# Patient Record
Sex: Male | Born: 1935 | Race: White | Hispanic: No | Marital: Married | Smoking: Never smoker
Health system: Southern US, Community
[De-identification: ages and names within clinical notes are randomized; demographics above are authoritative.]

## PROBLEM LIST (undated history)

## (undated) DIAGNOSIS — C801 Malignant (primary) neoplasm, unspecified: Secondary | ICD-10-CM

## (undated) DIAGNOSIS — E119 Type 2 diabetes mellitus without complications: Secondary | ICD-10-CM

## (undated) DIAGNOSIS — L89159 Pressure ulcer of sacral region, unspecified stage: Secondary | ICD-10-CM

## (undated) DIAGNOSIS — I219 Acute myocardial infarction, unspecified: Secondary | ICD-10-CM

## (undated) DIAGNOSIS — I4891 Unspecified atrial fibrillation: Secondary | ICD-10-CM

---

## 1998-11-09 ENCOUNTER — Inpatient Hospital Stay (HOSPITAL_COMMUNITY): Admission: AD | Admit: 1998-11-09 | Discharge: 1998-11-11 | Payer: Self-pay | Admitting: Cardiology

## 2005-06-21 ENCOUNTER — Ambulatory Visit: Payer: Self-pay | Admitting: Family Medicine

## 2005-11-24 ENCOUNTER — Inpatient Hospital Stay (HOSPITAL_COMMUNITY): Admission: RE | Admit: 2005-11-24 | Discharge: 2005-11-25 | Payer: Self-pay | Admitting: Cardiology

## 2005-11-24 ENCOUNTER — Ambulatory Visit: Payer: Self-pay | Admitting: Cardiovascular Disease

## 2008-02-25 ENCOUNTER — Encounter: Payer: Self-pay | Admitting: Urology

## 2008-03-24 ENCOUNTER — Encounter: Payer: Self-pay | Admitting: Urology

## 2008-04-23 ENCOUNTER — Encounter: Payer: Self-pay | Admitting: Urology

## 2008-05-24 ENCOUNTER — Encounter: Payer: Self-pay | Admitting: Urology

## 2008-06-24 ENCOUNTER — Encounter: Payer: Self-pay | Admitting: Urology

## 2009-01-12 ENCOUNTER — Ambulatory Visit: Payer: Self-pay

## 2011-08-29 ENCOUNTER — Emergency Department: Payer: Self-pay | Admitting: Emergency Medicine

## 2014-06-06 DIAGNOSIS — M8580 Other specified disorders of bone density and structure, unspecified site: Secondary | ICD-10-CM | POA: Insufficient documentation

## 2014-06-06 DIAGNOSIS — E785 Hyperlipidemia, unspecified: Secondary | ICD-10-CM | POA: Insufficient documentation

## 2014-06-06 DIAGNOSIS — E119 Type 2 diabetes mellitus without complications: Secondary | ICD-10-CM | POA: Insufficient documentation

## 2014-06-06 DIAGNOSIS — Z8546 Personal history of malignant neoplasm of prostate: Secondary | ICD-10-CM | POA: Insufficient documentation

## 2014-06-06 DIAGNOSIS — I2510 Atherosclerotic heart disease of native coronary artery without angina pectoris: Secondary | ICD-10-CM | POA: Insufficient documentation

## 2014-06-06 DIAGNOSIS — I10 Essential (primary) hypertension: Secondary | ICD-10-CM | POA: Insufficient documentation

## 2016-03-08 ENCOUNTER — Emergency Department: Payer: Medicare Other

## 2016-03-08 ENCOUNTER — Inpatient Hospital Stay: Payer: Medicare Other

## 2016-03-08 ENCOUNTER — Encounter: Payer: Self-pay | Admitting: *Deleted

## 2016-03-08 ENCOUNTER — Inpatient Hospital Stay
Admission: EM | Admit: 2016-03-08 | Discharge: 2016-03-15 | DRG: 853 | Disposition: A | Discharge status: Skilled Nursing Facility | Payer: Medicare Other | Attending: Internal Medicine | Admitting: Internal Medicine

## 2016-03-08 DIAGNOSIS — L8990 Pressure ulcer of unspecified site, unspecified stage: Secondary | ICD-10-CM | POA: Diagnosis present

## 2016-03-08 DIAGNOSIS — Z7982 Long term (current) use of aspirin: Secondary | ICD-10-CM

## 2016-03-08 DIAGNOSIS — E1065 Type 1 diabetes mellitus with hyperglycemia: Secondary | ICD-10-CM

## 2016-03-08 DIAGNOSIS — I248 Other forms of acute ischemic heart disease: Secondary | ICD-10-CM | POA: Diagnosis present

## 2016-03-08 DIAGNOSIS — B9561 Methicillin susceptible Staphylococcus aureus infection as the cause of diseases classified elsewhere: Secondary | ICD-10-CM | POA: Diagnosis present

## 2016-03-08 DIAGNOSIS — I4891 Unspecified atrial fibrillation: Secondary | ICD-10-CM | POA: Diagnosis not present

## 2016-03-08 DIAGNOSIS — D62 Acute posthemorrhagic anemia: Secondary | ICD-10-CM | POA: Diagnosis not present

## 2016-03-08 DIAGNOSIS — R4182 Altered mental status, unspecified: Secondary | ICD-10-CM | POA: Diagnosis present

## 2016-03-08 DIAGNOSIS — B965 Pseudomonas (aeruginosa) (mallei) (pseudomallei) as the cause of diseases classified elsewhere: Secondary | ICD-10-CM | POA: Diagnosis present

## 2016-03-08 DIAGNOSIS — I10 Essential (primary) hypertension: Secondary | ICD-10-CM | POA: Diagnosis present

## 2016-03-08 DIAGNOSIS — I252 Old myocardial infarction: Secondary | ICD-10-CM | POA: Diagnosis not present

## 2016-03-08 DIAGNOSIS — L89153 Pressure ulcer of sacral region, stage 3: Secondary | ICD-10-CM | POA: Diagnosis present

## 2016-03-08 DIAGNOSIS — I959 Hypotension, unspecified: Secondary | ICD-10-CM | POA: Diagnosis present

## 2016-03-08 DIAGNOSIS — Z880 Allergy status to penicillin: Secondary | ICD-10-CM

## 2016-03-08 DIAGNOSIS — R627 Adult failure to thrive: Secondary | ICD-10-CM | POA: Diagnosis not present

## 2016-03-08 DIAGNOSIS — E1165 Type 2 diabetes mellitus with hyperglycemia: Secondary | ICD-10-CM | POA: Diagnosis present

## 2016-03-08 DIAGNOSIS — I2510 Atherosclerotic heart disease of native coronary artery without angina pectoris: Secondary | ICD-10-CM | POA: Diagnosis present

## 2016-03-08 DIAGNOSIS — IMO0001 Reserved for inherently not codable concepts without codable children: Secondary | ICD-10-CM

## 2016-03-08 DIAGNOSIS — Z79899 Other long term (current) drug therapy: Secondary | ICD-10-CM

## 2016-03-08 DIAGNOSIS — E785 Hyperlipidemia, unspecified: Secondary | ICD-10-CM | POA: Diagnosis present

## 2016-03-08 DIAGNOSIS — N3289 Other specified disorders of bladder: Secondary | ICD-10-CM | POA: Diagnosis present

## 2016-03-08 DIAGNOSIS — L089 Local infection of the skin and subcutaneous tissue, unspecified: Secondary | ICD-10-CM

## 2016-03-08 DIAGNOSIS — I482 Chronic atrial fibrillation: Secondary | ICD-10-CM | POA: Diagnosis present

## 2016-03-08 DIAGNOSIS — T148XXA Other injury of unspecified body region, initial encounter: Secondary | ICD-10-CM

## 2016-03-08 DIAGNOSIS — E43 Unspecified severe protein-calorie malnutrition: Secondary | ICD-10-CM | POA: Diagnosis present

## 2016-03-08 DIAGNOSIS — Z7984 Long term (current) use of oral hypoglycemic drugs: Secondary | ICD-10-CM | POA: Diagnosis not present

## 2016-03-08 DIAGNOSIS — Z7401 Bed confinement status: Secondary | ICD-10-CM | POA: Diagnosis not present

## 2016-03-08 DIAGNOSIS — I2699 Other pulmonary embolism without acute cor pulmonale: Secondary | ICD-10-CM | POA: Diagnosis present

## 2016-03-08 DIAGNOSIS — R6251 Failure to thrive (child): Secondary | ICD-10-CM | POA: Insufficient documentation

## 2016-03-08 DIAGNOSIS — N390 Urinary tract infection, site not specified: Secondary | ICD-10-CM | POA: Diagnosis present

## 2016-03-08 DIAGNOSIS — A419 Sepsis, unspecified organism: Secondary | ICD-10-CM | POA: Diagnosis present

## 2016-03-08 HISTORY — DX: Unspecified atrial fibrillation: I48.91

## 2016-03-08 HISTORY — DX: Type 2 diabetes mellitus without complications: E11.9

## 2016-03-08 HISTORY — DX: Malignant (primary) neoplasm, unspecified: C80.1

## 2016-03-08 HISTORY — DX: Acute myocardial infarction, unspecified: I21.9

## 2016-03-08 LAB — LACTIC ACID, PLASMA
LACTIC ACID, VENOUS: 2.9 mmol/L — AB (ref 0.5–2.0)
Lactic Acid, Venous: 4.5 mmol/L (ref 0.5–2.0)

## 2016-03-08 LAB — URINALYSIS COMPLETE WITH MICROSCOPIC (ARMC ONLY)
BILIRUBIN URINE: NEGATIVE
Bacteria, UA: NONE SEEN
Nitrite: NEGATIVE
Protein, ur: NEGATIVE mg/dL
Specific Gravity, Urine: 1.023 (ref 1.005–1.030)
pH: 5 (ref 5.0–8.0)

## 2016-03-08 LAB — GLUCOSE, CAPILLARY
GLUCOSE-CAPILLARY: 555 mg/dL — AB (ref 65–99)
GLUCOSE-CAPILLARY: 463 mg/dL — AB (ref 65–99)
Glucose-Capillary: 491 mg/dL — ABNORMAL HIGH (ref 65–99)
Glucose-Capillary: 356 mg/dL — ABNORMAL HIGH (ref 65–99)

## 2016-03-08 LAB — HEPATIC FUNCTION PANEL
ALBUMIN: 2.4 g/dL — AB (ref 3.5–5.0)
ALK PHOS: 68 U/L (ref 38–126)
ALT: 18 U/L (ref 17–63)
AST: 31 U/L (ref 15–41)
BILIRUBIN TOTAL: 1.2 mg/dL (ref 0.3–1.2)
Bilirubin, Direct: 0.3 mg/dL (ref 0.1–0.5)
Indirect Bilirubin: 0.9 mg/dL (ref 0.3–0.9)
TOTAL PROTEIN: 5.6 g/dL — AB (ref 6.5–8.1)

## 2016-03-08 LAB — CBC
HCT: 40.4 % (ref 40.0–52.0)
Hemoglobin: 12.7 g/dL — ABNORMAL LOW (ref 13.0–18.0)
MCH: 30.5 pg (ref 26.0–34.0)
MCHC: 31.6 g/dL — ABNORMAL LOW (ref 32.0–36.0)
MCV: 96.5 fL (ref 80.0–100.0)
PLATELETS: 171 10*3/uL (ref 150–440)
RBC: 4.18 MIL/uL — AB (ref 4.40–5.90)
RDW: 14.7 % — AB (ref 11.5–14.5)
WBC: 12.7 10*3/uL — AB (ref 3.8–10.6)

## 2016-03-08 LAB — TROPONIN I
TROPONIN I: 0.16 ng/mL — AB (ref <0.031)
Troponin I: 0.06 ng/mL — ABNORMAL HIGH (ref <0.031)

## 2016-03-08 LAB — DIGOXIN LEVEL: DIGOXIN LVL: 0.2 ng/mL — AB (ref 0.8–2.0)

## 2016-03-08 LAB — BASIC METABOLIC PANEL
ANION GAP: 15 (ref 5–15)
BUN: 36 mg/dL — ABNORMAL HIGH (ref 6–20)
CALCIUM: 8 mg/dL — AB (ref 8.9–10.3)
CHLORIDE: 99 mmol/L — AB (ref 101–111)
CO2: 14 mmol/L — AB (ref 22–32)
Creatinine, Ser: 0.64 mg/dL (ref 0.61–1.24)
GFR calc non Af Amer: >60 mL/min (ref >60)
Glucose, Bld: 515 mg/dL (ref 65–99)
POTASSIUM: 4 mmol/L (ref 3.5–5.1)
Sodium: 128 mmol/L — ABNORMAL LOW (ref 135–145)

## 2016-03-08 LAB — BRAIN NATRIURETIC PEPTIDE: B Natriuretic Peptide: 258 pg/mL — ABNORMAL HIGH (ref 0.0–100.0)

## 2016-03-08 MED ORDER — INSULIN ASPART 100 UNIT/ML ~~LOC~~ SOLN
0.0000 [IU] | Freq: Three times a day (TID) | SUBCUTANEOUS | Status: DC
  Administered 2016-03-09 (×2): 5 [IU] via SUBCUTANEOUS
  Administered 2016-03-09 – 2016-03-10 (×2): 8 [IU] via SUBCUTANEOUS
  Administered 2016-03-10: 11 [IU] via SUBCUTANEOUS
  Filled 2016-03-08: qty 11
  Filled 2016-03-08: qty 5
  Filled 2016-03-08: qty 8
  Filled 2016-03-08: qty 5
  Filled 2016-03-08: qty 8

## 2016-03-08 MED ORDER — INSULIN ASPART 100 UNIT/ML ~~LOC~~ SOLN
20.0000 [IU] | Freq: Once | SUBCUTANEOUS | Status: AC
  Administered 2016-03-08: 20 [IU] via SUBCUTANEOUS
  Filled 2016-03-08: qty 20

## 2016-03-08 MED ORDER — VANCOMYCIN HCL IN DEXTROSE 1-5 GM/200ML-% IV SOLN
1000.0000 mg | Freq: Once | INTRAVENOUS | Status: AC
  Administered 2016-03-08: 1000 mg via INTRAVENOUS
  Filled 2016-03-08: qty 200

## 2016-03-08 MED ORDER — DEXTROSE 5 % IV SOLN
2.0000 g | Freq: Two times a day (BID) | INTRAVENOUS | Status: DC
  Administered 2016-03-09 – 2016-03-11 (×5): 2 g via INTRAVENOUS
  Filled 2016-03-08 (×7): qty 2

## 2016-03-08 MED ORDER — INSULIN ASPART 100 UNIT/ML ~~LOC~~ SOLN: 20.0000 [IU] | Freq: Once | SUBCUTANEOUS | Status: DC

## 2016-03-08 MED ORDER — INSULIN ASPART 100 UNIT/ML ~~LOC~~ SOLN
0.0000 [IU] | Freq: Every day | SUBCUTANEOUS | Status: DC
  Administered 2016-03-08: 5 [IU] via SUBCUTANEOUS
  Administered 2016-03-09: 3 [IU] via SUBCUTANEOUS
  Administered 2016-03-10: 5 [IU] via SUBCUTANEOUS
  Administered 2016-03-11: 2 [IU] via SUBCUTANEOUS
  Administered 2016-03-12: 5 [IU] via SUBCUTANEOUS
  Administered 2016-03-13 – 2016-03-14 (×2): 3 [IU] via SUBCUTANEOUS
  Filled 2016-03-08: qty 3
  Filled 2016-03-08 (×2): qty 5
  Filled 2016-03-08: qty 2
  Filled 2016-03-08 (×2): qty 3
  Filled 2016-03-08: qty 5

## 2016-03-08 MED ORDER — DIGOXIN 125 MCG PO TABS
0.1250 mg | ORAL_TABLET | Freq: Every day | ORAL | Status: DC
  Administered 2016-03-08 – 2016-03-15 (×8): 0.125 mg via ORAL
  Filled 2016-03-08 (×8): qty 1

## 2016-03-08 MED ORDER — ASPIRIN EC 81 MG PO TBEC
81.0000 mg | DELAYED_RELEASE_TABLET | Freq: Every day | ORAL | Status: DC
  Administered 2016-03-08 – 2016-03-10 (×3): 81 mg via ORAL
  Filled 2016-03-08 (×3): qty 1

## 2016-03-08 MED ORDER — SODIUM CHLORIDE 0.9% FLUSH
3.0000 mL | Freq: Two times a day (BID) | INTRAVENOUS | Status: DC
  Administered 2016-03-08 – 2016-03-15 (×12): 3 mL via INTRAVENOUS

## 2016-03-08 MED ORDER — ADULT MULTIVITAMIN W/MINERALS CH
1.0000 | ORAL_TABLET | Freq: Every day | ORAL | Status: DC
  Administered 2016-03-11 – 2016-03-15 (×5): 1 via ORAL
  Filled 2016-03-08 (×5): qty 1

## 2016-03-08 MED ORDER — DEXTROSE 5 % IV SOLN
2.0000 g | Freq: Once | INTRAVENOUS | Status: AC
  Administered 2016-03-08: 2 g via INTRAVENOUS
  Filled 2016-03-08: qty 2

## 2016-03-08 MED ORDER — ONDANSETRON HCL 4 MG PO TABS: 4.0000 mg | ORAL_TABLET | Freq: Four times a day (QID) | ORAL | Status: DC | PRN

## 2016-03-08 MED ORDER — EDOXABAN TOSYLATE 30 MG PO TABS: 30.0000 mg | ORAL_TABLET | Freq: Every day | ORAL | Status: DC

## 2016-03-08 MED ORDER — SODIUM CHLORIDE 0.9 % IV BOLUS (SEPSIS): 1000.0000 mL | Freq: Once | INTRAVENOUS | Status: DC

## 2016-03-08 MED ORDER — METOPROLOL SUCCINATE ER 50 MG PO TB24
50.0000 mg | ORAL_TABLET | Freq: Every day | ORAL | Status: DC
  Administered 2016-03-08 – 2016-03-11 (×4): 50 mg via ORAL
  Filled 2016-03-08 (×5): qty 1

## 2016-03-08 MED ORDER — DIATRIZOATE MEGLUMINE & SODIUM 66-10 % PO SOLN: 15.0000 mL | Freq: Once | ORAL | Status: DC

## 2016-03-08 MED ORDER — IOPAMIDOL (ISOVUE-300) INJECTION 61%
100.0000 mL | Freq: Once | INTRAVENOUS | Status: AC | PRN
  Administered 2016-03-08: 100 mL via INTRAVENOUS

## 2016-03-08 MED ORDER — INSULIN ASPART 100 UNIT/ML ~~LOC~~ SOLN
5.0000 [IU] | Freq: Once | SUBCUTANEOUS | Status: AC
  Administered 2016-03-08: 5 [IU] via INTRAVENOUS
  Filled 2016-03-08: qty 5

## 2016-03-08 MED ORDER — RENA-VITE PO TABS
1.0000 | ORAL_TABLET | Freq: Every day | ORAL | Status: DC
  Administered 2016-03-11 – 2016-03-15 (×5): 1 via ORAL
  Filled 2016-03-08 (×6): qty 1

## 2016-03-08 MED ORDER — ACETAMINOPHEN 650 MG RE SUPP: 650.0000 mg | Freq: Four times a day (QID) | RECTAL | Status: DC | PRN

## 2016-03-08 MED ORDER — INSULIN ASPART 100 UNIT/ML ~~LOC~~ SOLN: 0.0000 [IU] | Freq: Three times a day (TID) | SUBCUTANEOUS | Status: DC

## 2016-03-08 MED ORDER — SODIUM CHLORIDE 0.9 % IV SOLN: Freq: Once | INTRAVENOUS | Status: DC

## 2016-03-08 MED ORDER — SODIUM CHLORIDE 0.9 % IV BOLUS (SEPSIS)
1000.0000 mL | Freq: Once | INTRAVENOUS | Status: AC
  Administered 2016-03-08: 1000 mL via INTRAVENOUS

## 2016-03-08 MED ORDER — ACETAMINOPHEN 325 MG PO TABS: 650.0000 mg | ORAL_TABLET | Freq: Four times a day (QID) | ORAL | Status: DC | PRN

## 2016-03-08 MED ORDER — MAGNESIUM OXIDE 400 (241.3 MG) MG PO TABS
400.0000 mg | ORAL_TABLET | Freq: Two times a day (BID) | ORAL | Status: DC
  Administered 2016-03-08 – 2016-03-15 (×14): 400 mg via ORAL
  Filled 2016-03-08 (×14): qty 1

## 2016-03-08 MED ORDER — VITAMIN D 1000 UNITS PO TABS
1000.0000 [IU] | ORAL_TABLET | Freq: Every day | ORAL | Status: DC
  Administered 2016-03-11 – 2016-03-15 (×5): 1000 [IU] via ORAL
  Filled 2016-03-08 (×5): qty 1

## 2016-03-08 MED ORDER — ONDANSETRON HCL 4 MG/2ML IJ SOLN: 4.0000 mg | Freq: Four times a day (QID) | INTRAMUSCULAR | Status: DC | PRN

## 2016-03-08 MED ORDER — ENOXAPARIN SODIUM 80 MG/0.8ML ~~LOC~~ SOLN
1.0000 mg/kg | Freq: Two times a day (BID) | SUBCUTANEOUS | Status: DC
  Administered 2016-03-09: 75 mg via SUBCUTANEOUS
  Filled 2016-03-08: qty 0.8

## 2016-03-08 MED ORDER — SODIUM CHLORIDE 0.9 % IV SOLN
INTRAVENOUS | Status: DC
Start: 2016-03-08 — End: 2016-03-11
  Administered 2016-03-08 – 2016-03-11 (×4): via INTRAVENOUS

## 2016-03-08 MED ORDER — MORPHINE SULFATE (PF) 2 MG/ML IV SOLN: 2.0000 mg | INTRAVENOUS | Status: DC | PRN

## 2016-03-08 MED ORDER — ENOXAPARIN SODIUM 40 MG/0.4ML ~~LOC~~ SOLN: 40.0000 mg | SUBCUTANEOUS | Status: DC

## 2016-03-08 MED ORDER — ATORVASTATIN CALCIUM 10 MG PO TABS
10.0000 mg | ORAL_TABLET | Freq: Every day | ORAL | Status: DC
  Administered 2016-03-08 – 2016-03-14 (×7): 10 mg via ORAL
  Filled 2016-03-08 (×7): qty 1

## 2016-03-08 MED ORDER — RIVAROXABAN 20 MG PO TABS
20.0000 mg | ORAL_TABLET | Freq: Every day | ORAL | Status: DC
  Administered 2016-03-08: 20 mg via ORAL
  Filled 2016-03-08: qty 1

## 2016-03-08 MED ORDER — OXYCODONE HCL 5 MG PO TABS: 5.0000 mg | ORAL_TABLET | ORAL | Status: DC | PRN

## 2016-03-08 MED ORDER — VANCOMYCIN HCL IN DEXTROSE 1-5 GM/200ML-% IV SOLN
1000.0000 mg | Freq: Two times a day (BID) | INTRAVENOUS | Status: DC
  Administered 2016-03-08 – 2016-03-13 (×10): 1000 mg via INTRAVENOUS
  Filled 2016-03-08 (×11): qty 200

## 2016-03-08 NOTE — Progress Notes (Signed)
Notified Dr. Lavetta Nielsen of lactic acid 2.9 and troponin of 0.16. No new orders. MD stated that patient's abdominal CT that was partially completed showed big PE's as well as a very distended bladder. MD put in order for foley. Patient does have a distended abdomen. Staff will place foley now and then patient will be transported to CT scan to finish.

## 2016-03-08 NOTE — Progress Notes (Signed)
Patient admitted to 2A room 251. Patient is oriented, but is sleeping between conversations. Current CBG 463. Dr. Lavetta Nielsen paged and MD stated to give one time dose of 20 units and MD is changing correction scale for future. No family is currently at bedside. Attempted to contact son, but he did not answer. Patient states he did not take his medications this AM, so will give his cardiac meds once pharmacy has reviewed them along with insulin. Currently afib on tele.

## 2016-03-08 NOTE — Progress Notes (Signed)
ANTICOAGULATION CONSULT NOTE - Initial Consult  Pharmacy Consult for Lovenox  Indication: pulmonary embolus  Allergies  Allergen Reactions  . Penicillins Other (See Comments)    Reaction:  Unknown     Patient Measurements: Height: 5\' 11"  (180.3 cm) Weight: 170 lb (77.111 kg) IBW/kg (Calculated) : 75.3 Heparin Dosing Weight:   Vital Signs: Temp: 97 F (36.1 C) (05/16 1726) Temp Source: Oral (05/16 1355) BP: 120/91 mmHg (05/16 1726) Pulse Rate: 112 (05/16 1726)  Labs:  Recent Labs  03/08/16 1357  HGB 12.7*  HCT 40.4  PLT 171  CREATININE 0.64  TROPONINI 0.06*    Estimated Creatinine Clearance: 68 mL/min (by C-G formula based on Cr of 0.64).   Medical History: Past Medical History  Diagnosis Date  . Diabetes mellitus without complication ([place])   . MI (myocardial infarction) ([place])   . A-fib ([place])     Medications:  Prescriptions prior to admission  Medication Sig Dispense Refill Last Dose  . aspirin EC 81 MG tablet Take 81 mg by mouth daily.   unknown at unknown   . atorvastatin (LIPITOR) 10 MG tablet Take 10 mg by mouth at bedtime.   unknown at unknown  . b complex vitamins tablet Take 1 tablet by mouth daily.   unknown at unknown   . cholecalciferol (VITAMIN D) 1000 units tablet Take 1,000 Units by mouth daily.   unknown at unknown   . digoxin (LANOXIN) 0.125 MG tablet Take 0.125 mg by mouth daily.   unknown at unknown   . edoxaban (SAVAYSA) 30 MG TABS tablet Take 30 mg by mouth daily.   unknown at unknown   . furosemide (LASIX) 20 MG tablet Take 20 mg by mouth daily.   unknown at unknown  . hydrochlorothiazide (MICROZIDE) 12.5 MG capsule Take 12.5 mg by mouth daily.   unknown at unknown   . magnesium oxide (MAG-OX) 400 (241.3 Mg) MG tablet Take 400 mg by mouth 2 (two) times daily.   unknown at unknown   . metFORMIN (GLUCOPHAGE-XR) 750 MG 24 hr tablet Take 750 mg by mouth 3 (three) times daily.   unknown at unknown  . metoprolol succinate (TOPROL-XL) 50 MG 24  hr tablet Take 50 mg by mouth daily. Take with or immediately following a meal.   unknown at unknown   . Multiple Vitamin (MULTIVITAMIN WITH MINERALS) TABS tablet Take 1 tablet by mouth daily.   unknown at unknown     Assessment: Pharmacy consulted to dose lovenox in this 82 year old male with bilateral PE.   Pt was on prophylatic dose of Edoxaban at home but was transitioned to therapeutically equivalent dose of Xarelto on admission.  Pt received Xarelto 20 mg PO X 1 on 5/16 @ 18:13.    CrCl = 68 ml/min TBW = 77.1 kg   Goal of Therapy:  resolution of PE  Monitor platelets by anticoagulation protocol: Yes   Plan:  Will start lovenox 75 mg SQ Q12H on 5/17 @ 6:00.  Will monitor CBC daily.   Trasean Delima D 03/08/2016,6:28 PM

## 2016-03-08 NOTE — ED Notes (Signed)
Pt cleaned and bed changed, pt covered in urine, narcotic decubitus ulcer on sacrum, MD made aware

## 2016-03-08 NOTE — ED Notes (Signed)
EDP at bedside  

## 2016-03-08 NOTE — Progress Notes (Signed)
Discuss with radiologist - bilateral PE noted on CT -- will start therapeutic lovenox

## 2016-03-08 NOTE — ED Notes (Signed)
Pt resting in bed, bed changed and pt cleaned

## 2016-03-08 NOTE — ED Notes (Addendum)
Pt reports using real sugar for the past few days instead of splenda, EMS gave 500 cc fluid bolus in route, pt reports he lives at home with his wife who has dementia, per EMS pt was sitting in pool of urine, son states pt has not had a bath in a month

## 2016-03-08 NOTE — ED Provider Notes (Addendum)
[facility] Emergency Department Provider Note   ____________________________________________  Time seen: Approximately 2:33 PM  I have reviewed the triage vital signs and the nursing notes.   HISTORY  Chief Complaint Weakness and Hyperglycemia    HPI Paul Mcmahon is a 82 y.o. male who was brought in by family per EMS because patient has just progressively gotten worse over the past several days where he has been having increased edema to his left side, laying in his own urine and stool, unable to walk to the bathroom, and patient's son and her unable to get him to move him. The son states that he has chosen to stay in the recliner and not get assistance.They did call for home health but they were beyond their help.  Patient's sugars have been going as well as he has just dwindled. His son states he's been laying in the recliner for about 2-3 months with minimal movement. When EMS arrived to the house patient was sitting in a large amount of urine. Patient denies any pain anywhere, he denies any fever, chills, nausea, vomiting, or change in his bowels. The son states that he has to lift into a chair in order for him to have a bowel movement.   Past Medical History  Diagnosis Date  . Diabetes mellitus without complication ([place])   . MI (myocardial infarction) ([place])   . A-fib ([place])     There are no active problems to display for this patient.   History reviewed. No pertinent past surgical history.  No current outpatient prescriptions on file.  Allergies Penicillins  History reviewed. No pertinent family history.  Social History Social History  Substance Use Topics  . Smoking status: Unknown If Ever Smoked  . Smokeless tobacco: None  . Alcohol Use: None    Review of Systems Constitutional: No fever/chills Eyes: No visual changes. ENT: No sore throat. Cardiovascular: Denies chest pain. Respiratory: Denies shortness of  breath. Gastrointestinal: No abdominal pain.  No nausea, no vomiting.  No diarrhea.  No constipation. Genitourinary: Negative for dysuria.Patient having to urinate and stool on himself because he is unable to move. Musculoskeletal: Negative for back pain.Edema to the entire left side of his body including his left arm and left leg and foot. Skin: Negative for rash. Neurological: Negative for headaches, focal weakness or numbness. Patient just complains of generalized weakness all over and inability to walk  10-point ROS otherwise negative.  ____________________________________________   PHYSICAL EXAM:  VITAL SIGNS: ED Triage Vitals  Enc Vitals Group     BP 03/08/16 1355 134/99 mmHg     Pulse Rate 03/08/16 1355 100     Resp 03/08/16 1355 18     Temp 03/08/16 1355 98.3 F (36.8 C)     Temp Source 03/08/16 1355 Oral     SpO2 03/08/16 1355 93 %     Weight 03/08/16 1355 170 lb (77.111 kg)     Height 03/08/16 1355 5\' 11"  (1.803 m)     Head Cir --      Peak Flow --      Pain Score 03/08/16 1355 6     Pain Loc --      Pain Edu? --      Excl. in [place]? --     Constitutional: Alert and oriented. Well appearing and in no acute distress. Eyes: Conjunctivae are normal. PERRL. EOMI. Head: Atraumatic. Nose: No congestion/rhinnorhea. Mouth/Throat: Mucous membranes are dry.  Oropharynx non-erythematous. Neck: No stridor.  Cardiovascular: Normal rate, regular rhythm. Grossly normal heart sounds.  Good peripheral circulation. Respiratory: Normal respiratory effort.  No retractions. Lungs CTAB. Gastrointestinal: Soft and nontender. No distention. No abdominal bruits. No CVA tenderness. Patient with questionable tenderness in his right mid abdomen. The patient was flipped over he had an extensive large decubitus ulcer to his entire lower buttocks in the upper part of the scrotum with redness to the scrotum in a large area of skin that appears to be necrotic around the base of the scrotum and the  lower buttocks. Musculoskeletal: No lower extremity tenderness nor edema.  No joint effusions. Neurologic:  Normal speech and language. No gross focal neurologic deficits are appreciated. Patient bedridden and unable to walk with left sided edema to his left arm and left leg, 3+ pitting. Patient just generally weak all over I am minimal movement to his bilateral lower extremities. Skin:  Skin is warm, dry and intact. No rash noted. Large decubitus ulcer scrotum and buttocks Psychiatric: Mood and affect are normal. Speech and behavior are normal.  ____________________________________________   LABS (all labs ordered are listed, but only abnormal results are displayed)  Labs Reviewed  BASIC METABOLIC PANEL - Abnormal; Notable for the following:    Sodium 128 (*)    Chloride 99 (*)    CO2 14 (*)    Glucose, Bld 515 (*)    BUN 36 (*)    Calcium 8.0 (*)    All other components within normal limits  CBC - Abnormal; Notable for the following:    WBC 12.7 (*)    RBC 4.18 (*)    Hemoglobin 12.7 (*)    MCHC 31.6 (*)    RDW 14.7 (*)    All other components within normal limits  URINALYSIS COMPLETEWITH MICROSCOPIC ([facility] ONLY) - Abnormal; Notable for the following:    Color, Urine YELLOW (*)    APPearance CLEAR (*)    Glucose, UA >500 (*)    Ketones, ur 1+ (*)    Hgb urine dipstick 3+ (*)    Leukocytes, UA 1+ (*)    Squamous Epithelial / LPF 0-5 (*)    All other components within normal limits  GLUCOSE, CAPILLARY - Abnormal; Notable for the following:    Glucose-Capillary 491 (*)    All other components within normal limits  HEPATIC FUNCTION PANEL - Abnormal; Notable for the following:    Total Protein 5.6 (*)    Albumin 2.4 (*)    All other components within normal limits  BRAIN NATRIURETIC PEPTIDE - Abnormal; Notable for the following:    B Natriuretic Peptide 258.0 (*)    All other components within normal limits  TROPONIN I - Abnormal; Notable for the following:    Troponin I  0.06 (*)    All other components within normal limits  CULTURE, BLOOD (ROUTINE X 2)  CULTURE, BLOOD (ROUTINE X 2)  ANAEROBIC CULTURE  CBG MONITORING, ED   ____________________________________________  EKG ED ECG REPORT I, Ruby Cola, the attending physician, personally viewed and interpreted this ECG.   Date: 03/08/2016  EKG Time: 13542 Rhythm: atrial fibrillation, rate 93  Axis: Normal  Intervals:none  ST&T Change: Nonspecific ST and T-wave changes   ____________________________________________  RADIOLOGY Dg Chest Portable 1 View  03/08/2016  CLINICAL DATA:  Lower extremity edema. EXAM: PORTABLE CHEST 1 VIEW COMPARISON:  01/12/2009 FINDINGS: The heart size and mediastinal contours are within normal limits. Both lungs are clear. The visualized skeletal structures are unremarkable. IMPRESSION: No  active disease. Electronically Signed   By: Kathreen Devoid   On: 03/08/2016 14:54     ____________________________________________   PROCEDURES  Procedure(s) performed: None  Critical Care performed: No  ____________________________________________   INITIAL IMPRESSION / ASSESSMENT AND PLAN / ED COURSE  Pertinent labs & imaging results that were available during my care of the patient were reviewed by me and considered in my medical decision making (see chart for details).  3:31 PM Patient's glucometer was 491. Patient was started on IV fluids and given some IV insulin while we're waiting HIS labs and x-rays. Patient will get blood cultures drawn so that we can do some IV antibiotics for his decubitus ulcer. Patient will be given IV Zosyn and vancomycin.  3:31 PM Patient urinalysis showed a pretty significant urinary infection as well. Cephalexin that his white blood cell count and hyperglycemia, not only from his urinary tract infection but also from his extensive necrotic decubitus ulcer. Hospitalist will be consulted for  admission. ____________________________________________   FINAL CLINICAL IMPRESSION(S) / ED DIAGNOSES  Final diagnoses:  Atrial fibrillation with RVR ([place])  Uncontrolled type 1 diabetes mellitus without complication (H[facility])  Acute urinary tract infection  Decubitus ulcer  Wound infection ([place])  Failure to thrive (0-17)      NEW MEDICATIONS STARTED DURING THIS VISIT:  New Prescriptions   No medications on file     Note:  This document was prepared using Dragon voice recognition software and may include unintentional dictation errors.    Ruby Cola, MD 03/08/16 1528  Ruby Cola, MD 03/08/16 [phone]

## 2016-03-08 NOTE — ED Notes (Signed)
Pt arrives via EMS from home with progressive left sided weakness for months, states his CBG read high on his meter, states PO DM management

## 2016-03-08 NOTE — ED Notes (Signed)
Dr. Lovena Le notified of critical glucose

## 2016-03-08 NOTE — Progress Notes (Signed)
Patient admitted today from home with wife, who has dementia. Patient states he has been immobile for over three months now. When asking who helps him around the house he states his wife makes food sometimes, but his son comes over every day and checks on them. Patient has a stage 3 wound to his sacrum that is draining as well as necrotic tissue to his perineum and scrotum area. Heels are boggy and have deep tissue injuries. Wound is consulted to see patient. Complaining of some pain 4/10 in his buttock area, but does not want to take any medication at this time. Patient is completely oriented while awake and answers questions appropriately, but he will become drowsy in between talking and is still trying to eat food that is not there. Patient has a concentrated bulge in his abdomen. MD ordered to put a foley in due to seeing bladder distention on abdominal CT, foley is currently in place. Social work consult has also been added to evaluate home life.

## 2016-03-08 NOTE — H&P (Addendum)
[facility] [place] NAME: Paul Mcmahon    MR#:  [id number]  DATE OF BIRTH:  1925-09-04   DATE OF ADMISSION:  03/08/2016  PRIMARY CARE PHYSICIAN: Juluis Pitch, MD   REQUESTING/REFERRING PHYSICIAN: Lovena Le  CHIEF COMPLAINT:   Chief Complaint  Patient presents with  . Weakness  . Hyperglycemia    HISTORY OF PRESENT ILLNESS:  Paul Mcmahon  is a 82 y.o. male with a known history of Atrial fibrillation chronic, essential hypertension, type 2 diabetes who is presenting with his son at bedside for weakness and mental status issues. The patient is unable to provide meaningful information at this time given confusion when asked why he is here he states "I'm fine" and proceeds to eat an invisible sandwich. His son at bedside and provides extra information. Stating that for almost 3 months the patient is had declining mental status with weakness. No actual individual complaints were elicited the son states he just knew he was doing poorly. On presentation to the hospital patient was noted to be atrophic fibrillation rapid ventricular response as well as meeting septic criteria.  PAST MEDICAL HISTORY:   Past Medical History  Diagnosis Date  . Diabetes mellitus without complication ([place])   . MI (myocardial infarction) ([place])   . A-fib ([place])     PAST SURGICAL HISTORY:  History reviewed. No pertinent past surgical history.  SOCIAL HISTORY:   Social History  Substance Use Topics  . Smoking status: Unknown If Ever Smoked  . Smokeless tobacco: Not on file  . Alcohol Use: Not on file    FAMILY HISTORY:  History reviewed. No pertinent family history.  DRUG ALLERGIES:   Allergies  Allergen Reactions  . Penicillins Other (See Comments)    Reaction:  Unknown     REVIEW OF SYSTEMS:  REVIEW OF SYSTEMS:  Unreliable given patient's mental status   MEDICATIONS AT HOME:   Prior to Admission medications   Medication Sig Start Date End Date  Taking? Authorizing Provider  aspirin EC 81 MG tablet Take 81 mg by mouth daily.   Yes Historical Provider, MD  atorvastatin (LIPITOR) 10 MG tablet Take 10 mg by mouth at bedtime.   Yes Historical Provider, MD  b complex vitamins tablet Take 1 tablet by mouth daily.   Yes Historical Provider, MD  cholecalciferol (VITAMIN D) 1000 units tablet Take 1,000 Units by mouth daily.   Yes Historical Provider, MD  digoxin (LANOXIN) 0.125 MG tablet Take 0.125 mg by mouth daily.   Yes Historical Provider, MD  edoxaban (SAVAYSA) 30 MG TABS tablet Take 30 mg by mouth daily.   Yes Historical Provider, MD  furosemide (LASIX) 20 MG tablet Take 20 mg by mouth daily.   Yes Historical Provider, MD  hydrochlorothiazide (MICROZIDE) 12.5 MG capsule Take 12.5 mg by mouth daily.   Yes Historical Provider, MD  magnesium oxide (MAG-OX) 400 (241.3 Mg) MG tablet Take 400 mg by mouth 2 (two) times daily.   Yes Historical Provider, MD  metFORMIN (GLUCOPHAGE-XR) 750 MG 24 hr tablet Take 750 mg by mouth 3 (three) times daily.   Yes Historical Provider, MD  metoprolol succinate (TOPROL-XL) 50 MG 24 hr tablet Take 50 mg by mouth daily. Take with or immediately following a meal.   Yes Historical Provider, MD  Multiple Vitamin (MULTIVITAMIN WITH MINERALS) TABS tablet Take 1 tablet by mouth daily.   Yes Historical Provider, MD      VITAL SIGNS:  Blood pressure 115/89,  pulse 107, temperature 98.3 F (36.8 C), temperature source Oral, resp. rate 29, height 5\' 11"  (1.803 m), weight 170 lb (77.111 kg), SpO2 96 %.  PHYSICAL EXAMINATION:  VITAL SIGNS: Filed Vitals:   03/08/16 1355 03/08/16 1400  BP: 134/99 115/89  Pulse: 100 107  Temp: 98.3 F (36.8 C)   Resp: 18 29   GENERAL:82 y.o.male currently Moderate distress given mental status HEAD: Normocephalic, atraumatic.  EYES: Pupils equal, round, reactive to light. Extraocular muscles intact. No scleral icterus.  MOUTH: Moist mucosal membrane. Dentition intact. No abscess noted.    EAR, NOSE, THROAT: Clear without exudates. No external lesions.  NECK: Supple. No thyromegaly. No nodules. No JVD.  PULMONARY: Clear to ascultation, without wheeze rails or rhonci. No use of accessory muscles, Good respiratory effort. good air entry bilaterally CHEST: Nontender to palpation.  CARDIOVASCULAR: S1 and S2. Irregular rate and irregular rhythm No murmurs, rubs, or gallops. No edema. Pedal pulses 2+ bilaterally.  GASTROINTESTINAL: Soft, nontender, nondistended. No masses. Positive bowel sounds. No hepatosplenomegaly.  MUSCULOSKELETAL: No swelling, clubbing, or edema. Range of motion full in all extremities.  NEUROLOGIC: Cranial nerves II through XII are intact. No gross focal neurological deficits. Sensation intact. Reflexes intact.  SKIN: Large area of ulceration sacrum stage II approximately 10 x 8 cm with unstageable area of ulceration over perineum and scrotum with overlying black eschar no rashes, or cyanosis. Skin warm and dry. Turgor intact.  PSYCHIATRIC: Mood, affect flattened. The patient is awake, alert and oriented to self. Insight, judgment poor.    LABORATORY PANEL:   CBC  Recent Labs Lab 03/08/16 1357  WBC 12.7*  HGB 12.7*  HCT 40.4  PLT 171   ------------------------------------------------------------------------------------------------------------------  Chemistries   Recent Labs Lab 03/08/16 1357  NA 128*  K 4.0  CL 99*  CO2 14*  GLUCOSE 515*  BUN 36*  CREATININE 0.64  CALCIUM 8.0*  AST 31  ALT 18  ALKPHOS 68  BILITOT 1.2   ------------------------------------------------------------------------------------------------------------------  Cardiac Enzymes  Recent Labs Lab 03/08/16 1357  TROPONINI 0.06*   ------------------------------------------------------------------------------------------------------------------  RADIOLOGY:  Dg Chest Portable 1 View  03/08/2016  CLINICAL DATA:  Lower extremity edema. EXAM: PORTABLE CHEST 1 VIEW  COMPARISON:  01/12/2009 FINDINGS: The heart size and mediastinal contours are within normal limits. Both lungs are clear. The visualized skeletal structures are unremarkable. IMPRESSION: No active disease. Electronically Signed   By: Kathreen Devoid   On: 03/08/2016 14:54    EKG:  No orders found for this or any previous visit.  IMPRESSION AND PLAN:   82 year old Caucasian gentleman history of essential hypertension, type 2 diabetes non-insulin-requiring comes presenting with weakness and confusion. Found the be in A. fib RVR meeting septic criteria  1.Sepsis, meeting septic criteria by heart rate, leukocytosis present on arrival. Source UTI versus decubitus ulcer  Panculture. Broad-spectrum antibiotics including vancomycin/cefepime and taper antibiotics when culture data returns.   Continue IV fluid hydration to keep mean arterial pressure greater than 65. may require pressor therapy if blood pressure worsens. We will repeat lactic acid if the initial is greater than 2.2.  2. Sacral decubitus ulcer present on admission: We'll check lactic acid as well as CAT scan to assess gas formation if present for require surgical evaluation/intervention, consult wound therapy 3. Type 2 diabetes with hyperglycemia: IV fluid hydration sliding scale insulin 4. Atrial fibrillation rapid ventricular response continue with anticoagulation, goal heart rate less than 120 will try IV fluid hydration if not controlled we'll start Cardizem, check digoxin level  5. Essential hypertension Toprol 6. Venous thromboembolism prophylactic: Therapeutic anticoagulation     All the records are reviewed and case discussed with ED provider. Management plans discussed with the patient, family and they are in agreement.  CODE STATUS: Full code as discussed with son at bedside --  Explained poor prognosis given multiple issues  TOTAL TIME TAKING CARE OF THIS PATIENT: 63 critical care minutes.    Azure Barrales,  Karenann Cai.D on 03/08/2016  at 4:18 PM  Between 7am to 6pm - Pager - [phone]  After 6pm: House Pager: - [phone]  [facility]  Office  [phone]  CC: Primary care physician; Juluis Pitch, MD

## 2016-03-08 NOTE — Progress Notes (Signed)
Allergy Clarification  ED Technician asked patient about Penicillin allergy. Patient does not remember any significant reaction.   Larene Beach, PharmD

## 2016-03-08 NOTE — Progress Notes (Signed)
Pharmacy Antibiotic Note  Paul Mcmahon is a 82 y.o. male admitted on 03/08/2016 with wound infection with decubitus ulcer.  Pharmacy has been consulted for Vancomycin and Cefepime dosing.  Plan: Pk parameters:  Kel (hr-1): 0.061 Half-life (hrs): 11.36 Vd (liters): 53.90 (factor used: 0.7 L/kg)  Patient received Vancomycin 1 g Iv x 1. Will start Vancomycin 1 g IV q12 hours starting @ 20:00. Will order Vancomycin trough level ordered for 19:00 on 5/18   Height: 5\' 11"  (180.3 cm) Weight: 170 lb (77.111 kg) IBW/kg (Calculated) : 75.3  Temp (24hrs), Avg:98.3 F (36.8 C), Min:98.3 F (36.8 C), Max:98.3 F (36.8 C)   Recent Labs Lab 03/08/16 1357  WBC 12.7*  CREATININE 0.64    Estimated Creatinine Clearance: 68 mL/min (by C-G formula based on Cr of 0.64).    Allergies  Allergen Reactions  . Penicillins Other (See Comments)    Reaction:  Unknown     Antimicrobials this admission: Vancomycin  5/16 >>  Cefepime 5/16  >>   Dose adjustments this admission:   Microbiology results:  BCx:   UCx:    Sputum:    MRSA PCR:   Thank you for allowing pharmacy to be a part of this patient's care.  Ezell Poke D 03/08/2016 4:18 PM

## 2016-03-08 NOTE — ED Notes (Signed)
MD notified of CBG of 555 and increase in lethargy

## 2016-03-08 NOTE — ED Notes (Signed)
Dr. Edd Fabian notified of critical trop of 0.06

## 2016-03-09 ENCOUNTER — Inpatient Hospital Stay: Payer: Medicare Other

## 2016-03-09 LAB — BASIC METABOLIC PANEL
ANION GAP: 8 (ref 5–15)
BUN: 34 mg/dL — AB (ref 6–20)
CALCIUM: 9.4 mg/dL (ref 8.9–10.3)
CO2: 27 mmol/L (ref 22–32)
Chloride: 99 mmol/L — ABNORMAL LOW (ref 101–111)
Creatinine, Ser: 0.55 mg/dL — ABNORMAL LOW (ref 0.61–1.24)
GFR calc Af Amer: >60 mL/min (ref >60)
GLUCOSE: 257 mg/dL — AB (ref 65–99)
Potassium: 3.6 mmol/L (ref 3.5–5.1)
Sodium: 134 mmol/L — ABNORMAL LOW (ref 135–145)

## 2016-03-09 LAB — GLUCOSE, CAPILLARY
GLUCOSE-CAPILLARY: 250 mg/dL — AB (ref 65–99)
GLUCOSE-CAPILLARY: 270 mg/dL — AB (ref 65–99)
Glucose-Capillary: 239 mg/dL — ABNORMAL HIGH (ref 65–99)
Glucose-Capillary: 259 mg/dL — ABNORMAL HIGH (ref 65–99)
Glucose-Capillary: 282 mg/dL — ABNORMAL HIGH (ref 65–99)

## 2016-03-09 LAB — TROPONIN I
TROPONIN I: 0.08 ng/mL — AB (ref <0.031)
TROPONIN I: 0.14 ng/mL — AB (ref <0.031)

## 2016-03-09 LAB — CBC
HEMATOCRIT: 38.8 % — AB (ref 40.0–52.0)
HEMOGLOBIN: 13 g/dL (ref 13.0–18.0)
MCH: 31 pg (ref 26.0–34.0)
MCHC: 33.5 g/dL (ref 32.0–36.0)
MCV: 92.4 fL (ref 80.0–100.0)
Platelets: 177 10*3/uL (ref 150–440)
RBC: 4.2 MIL/uL — ABNORMAL LOW (ref 4.40–5.90)
RDW: 14.2 % (ref 11.5–14.5)
WBC: 12.6 10*3/uL — ABNORMAL HIGH (ref 3.8–10.6)

## 2016-03-09 LAB — HEMOGLOBIN A1C: HEMOGLOBIN A1C: 13 % — AB (ref 4.0–6.0)

## 2016-03-09 MED ORDER — APIXABAN 5 MG PO TABS
10.0000 mg | ORAL_TABLET | Freq: Two times a day (BID) | ORAL | Status: DC
  Administered 2016-03-10 (×2): 10 mg via ORAL
  Filled 2016-03-09 (×3): qty 2

## 2016-03-09 MED ORDER — DAKINS (1/4 STRENGTH) 0.125 % EX SOLN
Freq: Every day | CUTANEOUS | Status: AC
  Administered 2016-03-09 – 2016-03-11 (×3)
  Filled 2016-03-09 (×2): qty 473

## 2016-03-09 MED ORDER — ENSURE ENLIVE PO LIQD
237.0000 mL | Freq: Three times a day (TID) | ORAL | Status: DC
  Administered 2016-03-09 – 2016-03-15 (×15): 237 mL via ORAL

## 2016-03-09 MED ORDER — APIXABAN 5 MG PO TABS: 5.0000 mg | ORAL_TABLET | Freq: Two times a day (BID) | ORAL | Status: DC

## 2016-03-09 MED ORDER — INSULIN DETEMIR 100 UNIT/ML ~~LOC~~ SOLN
10.0000 [IU] | Freq: Every day | SUBCUTANEOUS | Status: DC
  Administered 2016-03-09: 10 [IU] via SUBCUTANEOUS
  Filled 2016-03-09 (×3): qty 0.1

## 2016-03-09 NOTE — Consult Note (Signed)
WOC wound consult note Reason for Consult:Unstageable pressure injury to sacrum, buttocks and scrotal area.  Albumin 2.4 Wound type:Unstageable pressure injury.  Pressure Ulcer POA: Yes Measurement:12 cm x 8 cm 100% adherent slough Foul necrotic odor.  Wound bed:100% devitalized tissue.  Deep tissue injury to right heel.  Drainage (amount, consistency, odor) Sacrum:  Heavy purulent drainage.  Foul odor.  Periwound:Erythema present circumferentially.  Dressing procedure/placement/frequency:Cleanse wound to sacrum and buttocks with NS and pat gently dry.  Apply Dakin's moistened gauze to wound bed for debridement.  Cover with ABD pads.  Change daily and PRN soilage.  Will not follow at this time.  Please re-consult if needed.  Domenic Moras RN BSN [place] Pager [phone]

## 2016-03-09 NOTE — Progress Notes (Addendum)
Inpatient Diabetes Program Recommendations  AACE/ADA: New Consensus Statement on Inpatient Glycemic Control (2015)  Target Ranges:  Prepandial:   less than 140 mg/dL      Peak postprandial:   less than 180 mg/dL (1-2 hours)      Critically ill patients:  140 - 180 mg/dL  Results for ANDRUE, GUIGNARD (MRN [id number]) as of 03/09/2016 08:02  Ref. Range 03/08/2016 14:08 03/08/2016 16:26 03/08/2016 17:21 03/08/2016 21:19 03/09/2016 07:38  Glucose-Capillary Latest Ref Range: 65-99 mg/dL 491 (H) 555 (HH) 463 (H) 356 (H) 239 (H)   Results for SHOTA, DUREL (MRN [id number]) as of 03/09/2016 08:02  Ref. Range 03/08/2016 17:26  Hemoglobin A1C Latest Ref Range: 4.0-6.0 % 13.0 (H)   Review of Glycemic Control  Diabetes history: DM2 Outpatient Diabetes medications: Metformin 750 mg TID (per hospital home medication list which is different than PCP office note on 03/02/16) Current orders for Inpatient glycemic control: Novolog 0-15 units TID with meals, Novolog 0-5 units QHS  Inpatient Diabetes Program Recommendations: Insulin - Basal: Please consider starting low dose basal insulin. Recommend starting with Levemir 10 units daily (starting now). Correction (SSI): Please consider increasing Novolog correction to Resistant scale. HgbA1C: A1C 13% on 03/08/16 indicating an average glucose of 326 mg/dl over the past 2-3 months.  NOTE: In reviewing the chart noted patient last seen PCP on 03/02/16 and according to home medications per MD note on 03/02/16 patient is prescribed Metformin 850 mg BID and Glyburide 10 mg daily for diabetes control. Labs on 03/02/16 (through [facility]) resulted with A1C of 12% on 03/02/16. Fasting glucose is 239 mg/dl this morning. Per H&P on 03/08/16 patient has been declining mental status and weakness over the past 3 months and patient's wife has dementia. Question if patient was taking DM medications as prescribed by PCP. A1C was 13% on 03/08/16 and initial glucose on admission was 491 mg/dl.  Anticipate patient will require insulin as an outpatient. MD, please indicate plan for outpatient diabetes control. If patient will be discharged on insulin please inform patient and staff so that bedside nursing can educate patient.  Thanks, Barnie Alderman, RN, MSN, CDE Diabetes Coordinator Inpatient Diabetes Program [phone] (Team Pager from [place] to [place]) [phone] ([facility] office) [phone] [facility] office) [phone] [facility] office)

## 2016-03-09 NOTE — Progress Notes (Signed)
ANTICOAGULATION CONSULT NOTE - Initial Consult  Pharmacy Consult for apixaban Indication: Bilateral PE  Allergies  Allergen Reactions  . Penicillins Other (See Comments)    Reaction:  Unknown    Patient Measurements: Height: 5\' 11"  (180.3 cm) Weight: 170 lb (77.111 kg) IBW/kg (Calculated) : 75.3  Vital Signs: Temp: 97.4 F (36.3 C) (05/17 0645) Temp Source: Oral (05/17 0645) BP: 104/60 mmHg (05/17 1246) Pulse Rate: 79 (05/17 1246)  Labs:  Recent Labs  03/08/16 1357 03/08/16 1726 03/08/16 2326 03/09/16 0439  HGB 12.7*  --   --  13.0  HCT 40.4  --   --  38.8*  PLT 171  --   --  177  CREATININE 0.64  --   --  0.55*  TROPONINI 0.06* 0.16* 0.08* 0.14*   Estimated Creatinine Clearance: 68 mL/min (by C-G formula based on Cr of 0.55).  Medical History: Past Medical History  Diagnosis Date  . Diabetes mellitus without complication ([place])   . MI (myocardial infarction) ([place])   . A-fib [facility])    Assessment: Pharmacy consulted to dose apixaban in this 82 year old male diagnosed with bilateral PE. Patient was prescribed edoxaban prior to admission and was transitioned to therapeutic enoxaparin for PE treatment.  CrCl 68 mL/min  Goal of Therapy:  Monitor platelets by anticoagulation protocol: Yes   Plan:  Discontinue enoxaparin  Order apixaban 10 mg PO BID x 7 days followed by apixaban 5 mg PO BID.  Pharmacy to monitor CBC per protocol.  Thank you for allowing pharmacy to be involved in this patients care.  Lenis Noon, PharmD Clinical Pharmacist 03/09/2016,2:56 PM

## 2016-03-09 NOTE — Progress Notes (Signed)
Initial Nutrition Assessment  DOCUMENTATION CODES:   Severe malnutrition in context of chronic illness  INTERVENTION:  -Recommend liberalizing diet to Regular -Recommend Ensure Enlive po TID, each supplement provides 350 kcal and 20 grams of protein; Magic Cup at [employer]  NUTRITION DIAGNOSIS:   Malnutrition related to chronic illness as evidenced by severe depletion of body fat, severe depletion of muscle mass, moderate to severe fluid accumulation.  GOAL:   Patient will meet greater than or equal to 90% of their needs  MONITOR:   PO intake, Supplement acceptance, Labs, Weight trends  REASON FOR ASSESSMENT:   Malnutrition Screening Tool    ASSESSMENT:    82 yo male admitted with weakness and confusion with sepsis, sacral pressure ulcer, bilateral PE; pt reports he significant weakness to the point he would sit in the chair or lie in once place for 15 to 20 hours at a time. Pt reports he as been basically immobile for the past 3 months. Noted pt reportedly cares for his wife who has dementia but when asked who prepares the meals, pt reports wife sometimes does. Pt also reports son checks on them every day.  Pt does report appetite has not been good and has not been eating well but cannot elaborate much more. Pt reports his wife drinks Ensure and he sometimes takes a sip but does not normally drink  Pt alert on visit today, cognition has been improving throughout the day per Amy RN.   Past Medical History  Diagnosis Date  . Diabetes mellitus without complication ([place])   . MI (myocardial infarction) ([place])   . A-fib [facility])     Diet Order:  Diet Heart Room service appropriate?: Yes; Fluid consistency:: Thin   Energy Intake: no recorded po intake today  Skin:   (large unstageable pressure ulcer to scrotum, buttock and sacrum)  Last BM:  03/09/16   Labs: sodium 134, potassium wdl  Glucose Profile:  Recent Labs  03/09/16 0738 03/09/16 1137 03/09/16 1640  GLUCAP  239* 259* 282*    Meds: NS at 50 ml/hr, ss novolog, novolog, levemir, MVI  Nutrition-Focused physical exam completed. Findings are moderate to severe fat depletion, moderate to severe muscle depletion, and moderate edema.   Height:   Ht Readings from Last 1 Encounters:  03/09/16 5\' 11"  (1.803 m)    Weight: pt reports UBW around 165-175 pounds; current wt 160 pounds today  Wt Readings from Last 1 Encounters:  03/09/16 160 lb 7 oz (72.774 kg)   Filed Weights   03/08/16 1355 03/09/16 1646  Weight: 170 lb (77.111 kg) 160 lb 7 oz (72.774 kg)    BMI:  Body mass index is 22.39 kg/(m^2).  Estimated Nutritional Needs:   Kcal:  2190-2555 kcals   Protein:  108-144 g   Fluid:  >/= 2 L  EDUCATION NEEDS:   No education needs identified at this time  [place], [place], [place] [phone] Pager  [phone] Weekend/On-Call Pager

## 2016-03-09 NOTE — Progress Notes (Signed)
Patient is alert and oriented to self and place.  Slowly becoming more oriented as the shift has progressed.  Family came by to visit for awhile and patient able to communicate with them appropriately.  No complaints of pain. Afib on monitor. VSS, on 2LNC.    Dressing changes done to decubs per wound care instructions.  Moderate amount of Serosanguineous draining.  Continue to monitor.

## 2016-03-09 NOTE — Progress Notes (Signed)
Pt back from [place]. CBG rechecked (250) - passing meds now. Patient sitting up to eat with assistance from NT.

## 2016-03-09 NOTE — Progress Notes (Signed)
Patient ID: Paul Mcmahon, male   DOB: 06-25-1925, 82 y.o.   MRN: [id number] [facility] PROGRESS NOTE  [facility] [id number] DOB: 1924-12-02 DOA: 03/08/2016 PCP: Juluis Pitch, MD  HPI/Subjective: Patient asked where he was. He states he does not feel that good. Unable to elaborate much on what is not right. Patient states he hasn't been able to move around too much lately.  Objective: Filed Vitals:   03/09/16 0645 03/09/16 1246  BP: 111/70 104/60  Pulse: 90 79  Temp: 97.4 F (36.3 C)   Resp: 18 18    Filed Weights   03/08/16 1355  Weight: 77.111 kg (170 lb)    ROS: Review of Systems  Constitutional: Negative for fever and chills.  Eyes: Negative for blurred vision.  Respiratory: Negative for cough and shortness of breath.   Cardiovascular: Negative for chest pain.  Gastrointestinal: Negative for nausea, vomiting, abdominal pain, diarrhea and constipation.  Genitourinary: Negative for dysuria.  Musculoskeletal: Negative for joint pain.  Neurological: Negative for dizziness and headaches.   Exam: Physical Exam  HENT:  Nose: No mucosal edema.  Mouth/Throat: No oropharyngeal exudate or posterior oropharyngeal edema.  Eyes: Conjunctivae, EOM and lids are normal. Pupils are equal, round, and reactive to light.  Neck: No JVD present. Carotid bruit is not present. No edema present. No thyroid mass and no thyromegaly present.  Cardiovascular: S1 normal and S2 normal.  Exam reveals no gallop.   No murmur heard. Pulses:      Dorsalis pedis pulses are 2+ on the right side, and 2+ on the left side.  Respiratory: No respiratory distress. He has no wheezes. He has no rhonchi. He has no rales.  GI: Soft. Bowel sounds are normal. There is no tenderness.  Musculoskeletal:       Right ankle: He exhibits swelling.       Left ankle: He exhibits swelling.  Lymphadenopathy:    He has no cervical adenopathy.  Neurological: He is alert.  Skin: Skin is warm. Nails show no  clubbing.  Left hip stage II decubiti skin tears. As per nurse sacrum is a stage III.  Psychiatric: He has a normal mood and affect.      Data Reviewed: Basic Metabolic Panel:  Recent Labs Lab 03/08/16 1357 03/09/16 0439  NA 128* 134*  K 4.0 3.6  CL 99* 99*  CO2 14* 27  GLUCOSE 515* 257*  BUN 36* 34*  CREATININE 0.64 0.55*  CALCIUM 8.0* 9.4   Liver Function Tests:  Recent Labs Lab 03/08/16 1357  AST 31  ALT 18  ALKPHOS 68  BILITOT 1.2  PROT 5.6*  ALBUMIN 2.4*   CBC:  Recent Labs Lab 03/08/16 1357 03/09/16 0439  WBC 12.7* 12.6*  HGB 12.7* 13.0  HCT 40.4 38.8*  MCV 96.5 92.4  PLT 171 177   Cardiac Enzymes:  Recent Labs Lab 03/08/16 1357 03/08/16 1726 03/08/16 2326 03/09/16 0439  TROPONINI 0.06* 0.16* 0.08* 0.14*   BNP (last 3 results)  Recent Labs  03/08/16 1357  BNP 258.0*    CBG:  Recent Labs Lab 03/08/16 1626 03/08/16 1721 03/08/16 2119 03/09/16 0738 03/09/16 1137  GLUCAP 555* 463* 356* 239* 259*    Recent Results (from the past 240 hour(s))  Anaerobic culture     Status: None (Preliminary result)   Collection Time: 03/08/16  3:58 PM  Result Value Ref Range Status   Specimen Description BUTTOCKS  Final   Special Requests NONE  Final   Culture PENDING  Incomplete   Report Status PENDING  Incomplete  Wound culture     Status: None (Preliminary result)   Collection Time: 03/08/16  3:58 PM  Result Value Ref Range Status   Specimen Description BUTTOCKS  Final   Special Requests NONE  Final   Gram Stain   Final    FEW WBC SEEN MANY GRAM POSITIVE COCCI MANY GRAM NEGATIVE RODS FEW GRAM POSITIVE RODS    Culture CULTURE REINCUBATED FOR BETTER GROWTH  Final   Report Status PENDING  Incomplete     Studies: Ct Abdomen Pelvis W Contrast  03/08/2016  ADDENDUM REPORT: 03/08/2016 19:21 ADDENDUM: Further imaging has now been obtained to evaluate the perineal and scrotal area. Mild scrotal thickening is noted although no abscess is  seen. No other focal abnormality is seen. Electronically Signed   By: Inez Catalina M.D.   On: 03/08/2016 19:21  03/08/2016  CLINICAL DATA:  Incontinence stools and urine with left-sided swelling EXAM: CT ABDOMEN AND PELVIS WITH CONTRAST TECHNIQUE: Multidetector CT imaging of the abdomen and pelvis was performed using the standard protocol following bolus administration of intravenous contrast. CONTRAST:  181mL ISOVUE-300 IOPAMIDOL (ISOVUE-300) INJECTION 61% COMPARISON:  None. FINDINGS: Lung bases demonstrate no evidence focal infiltrate or sizable effusion. There are however changes consistent with bilateral pulmonary emboli. The right ventricle is somewhat enlarged consistent with a degree of right heart strain. Some minimal atelectatic changes in the right lower lobe are seen. The liver, gallbladder, spleen, adrenal glands and pancreas are within normal limits. Kidneys are well visualized bilaterally and demonstrate bilateral renal cystic change. Prominence of the right renal collecting system is noted although no obstructing stone is seen. This is likely related to the over distension of the urinary bladder. Similar changes are noted on the left. Aortoiliac calcifications are identified. No pelvic mass lesion or sidewall abnormality is noted. Changes of prior prostate brachytherapy are seen. The appendix is not visualized although no inflammatory changes are seen. The bony structures show degenerative change of the lumbar spine. No acute compression deformity is seen. Soft tissue swelling is noted in the left lower extremity likely related to deep venous thrombosis given the pulmonary emboli. Further evaluation by means of ultrasound can be performed as clinically indicated. By history there is a scrotal/perineal wound which was incompletely evaluated on this scan. Attempts to retrieve the patient for further scanning have been unsuccessful to this point. IMPRESSION: Bilateral lower lobe pulmonary emboli. There  are changes signifying right heart strain consistent with increased morbidity and mortality. Bilateral renal cystic change. Significantly distended urinary bladder. This may reflect a degree of bladder outlet obstruction. Compensatory dilatation of the collecting systems is noted bilaterally. Left lower extremity swelling likely related to deep venous thrombosis. By history there is a scrotal/perineal wound. Attempts to retrieve the patient for additional imaging were unsuccessful to this point due to Clinical needs on the floor. These results were called by telephone at the time of interpretation on 03/08/2016 at 6:06 pm to Dr. Valentino Nose , who verbally acknowledged these results. Electronically Signed: By: Inez Catalina M.D. On: 03/08/2016 18:14   Dg Chest Portable 1 View  03/08/2016  CLINICAL DATA:  Lower extremity edema. EXAM: PORTABLE CHEST 1 VIEW COMPARISON:  01/12/2009 FINDINGS: The heart size and mediastinal contours are within normal limits. Both lungs are clear. The visualized skeletal structures are unremarkable. IMPRESSION: No active disease. Electronically Signed   By: Kathreen Devoid   On: 03/08/2016 14:54    Scheduled Meds: .  apixaban  10 mg Oral BID   Followed by  . [START ON 03/16/2016] apixaban  5 mg Oral BID  . aspirin EC  81 mg Oral Daily  . atorvastatin  10 mg Oral QHS  . ceFEPime (MAXIPIME) IV  2 g Intravenous Q12H  . cholecalciferol  1,000 Units Oral Daily  . diatrizoate meglumine-sodium  15 mL Oral Once  . digoxin  0.125 mg Oral Daily  . insulin aspart  0-15 Units Subcutaneous TID WC  . insulin aspart  0-5 Units Subcutaneous QHS  . insulin detemir  10 Units Subcutaneous QHS  . magnesium oxide  400 mg Oral BID  . metoprolol succinate  50 mg Oral Daily  . multivitamin  1 tablet Oral Daily  . multivitamin with minerals  1 tablet Oral Daily  . sodium chloride  1,000 mL Intravenous Once  . sodium chloride flush  3 mL Intravenous Q12H  . sodium hypochlorite   Irrigation Daily   . vancomycin  1,000 mg Intravenous Q12H   Continuous Infusions: . sodium chloride 50 mL/hr at 03/09/16 1146    Assessment/Plan:   1. Clinical sepsis. Source could be sacral decubiti versus acute cystitis with hematuria. Patient has tachycardia and leukocytosis. Patient placed on aggressive antibiotics with vancomycin and cefepime. Follow up cultures. 2. Sacral decubiti. Appreciate wound consultation. Continue IV antibiotics 3. Severe bladder distention Foley catheter placed. Start Flomax 4. Bilateral lower pulmonary embolism. Patient started on high-dose Lovenox I will switch over to eliquis. Sonogram of the lower extremities to rule out DVT 5. Atrial fibrillation with rapid ventricular response. Patient started on Cardizem.  6. Elevated troponin. Likely demand ischemia from rapid ventricular response 7. Essential hypertension continue usual medications 8. Hyperlipidemia unspecified on atorvastatin  Code Status:     Code Status Orders        Start     Ordered   03/08/16 1556  Full code   Continuous     03/08/16 1557    Code Status History    Date Active Date Inactive Code Status Order ID Comments User Context   This patient has a current code status but no historical code status.    Advance Directive Documentation        Most Recent Value   Type of Advance Directive  Healthcare Power of Attorney, Living will   Pre-existing out of facility DNR order (yellow form or pink MOST form)     "MOST" Form in Place?       Family Communication: Spoke with family at bedside  Disposition Plan: Will need skilled nursing facility  Antibiotics:  Vancomycin  Cefepime  Time spent: 25 minutes  [place], [place]

## 2016-03-09 NOTE — Consult Note (Signed)
[facility] Consultation Note  Patient ID: Paul Mcmahon, MRN: [id number], DOB/AGE: 29-Dec-1924 82 y.o. Admit date: 03/08/2016   Date of Consult: 03/09/2016 Primary Physician: Juluis Pitch, MD Primary Cardiologist:None  Chief Complaint:  Chief Complaint  Patient presents with  . Weakness  . Hyperglycemia   Reason for Consult: chronic atrial fibrillation with elevated troponin   HPI: 82 y.o. male with known chronic atrial fibrillation non-valvular in nature with essential hypertension diabetes with complication old myocardial infarction with some difficulty recently with concerns of decreased sensorium and weakness. The patient was seen in the emergency room with a reasonable heart rate control of atrial fibrillation although did wax and wane requiring additional intervention. Patient typically has been going good blood pressure and heart rate control with digoxin and beta blocker. Additionally the patient has had no evidence of treatment with anticoagulation despite relatively high chads score. The patient has had diabetes with some complication currently stable at this time. Additionally high-intensity cholesterol therapy has been used. The patient has had no evidence of heart failure and/or chest pain at this time with a slight elevation of troponin of 0.16 possibly consistent with demand ischemia and no current evidence of myocardial infarction  Past Medical History  Diagnosis Date  . Diabetes mellitus without complication ([place])   . MI (myocardial infarction) ([place])   . A-fib [facility])       Surgical History: History reviewed. No pertinent past surgical history.   Home Meds: Prior to Admission medications   Medication Sig Start Date End Date Taking? Authorizing Provider  aspirin EC 81 MG tablet Take 81 mg by mouth daily.   Yes Historical Provider, MD  atorvastatin (LIPITOR) 10 MG tablet Take 10 mg by mouth at bedtime.   Yes Historical Provider, MD  b complex vitamins  tablet Take 1 tablet by mouth daily.   Yes Historical Provider, MD  cholecalciferol (VITAMIN D) 1000 units tablet Take 1,000 Units by mouth daily.   Yes Historical Provider, MD  digoxin (LANOXIN) 0.125 MG tablet Take 0.125 mg by mouth daily.   Yes Historical Provider, MD  edoxaban (SAVAYSA) 30 MG TABS tablet Take 30 mg by mouth daily.   Yes Historical Provider, MD  furosemide (LASIX) 20 MG tablet Take 20 mg by mouth daily.   Yes Historical Provider, MD  hydrochlorothiazide (MICROZIDE) 12.5 MG capsule Take 12.5 mg by mouth daily.   Yes Historical Provider, MD  magnesium oxide (MAG-OX) 400 (241.3 Mg) MG tablet Take 400 mg by mouth 2 (two) times daily.   Yes Historical Provider, MD  metFORMIN (GLUCOPHAGE-XR) 750 MG 24 hr tablet Take 750 mg by mouth 3 (three) times daily.   Yes Historical Provider, MD  metoprolol succinate (TOPROL-XL) 50 MG 24 hr tablet Take 50 mg by mouth daily. Take with or immediately following a meal.   Yes Historical Provider, MD  Multiple Vitamin (MULTIVITAMIN WITH MINERALS) TABS tablet Take 1 tablet by mouth daily.   Yes Historical Provider, MD    Inpatient Medications:  . aspirin EC  81 mg Oral Daily  . atorvastatin  10 mg Oral QHS  . ceFEPime (MAXIPIME) IV  2 g Intravenous Q12H  . cholecalciferol  1,000 Units Oral Daily  . diatrizoate meglumine-sodium  15 mL Oral Once  . digoxin  0.125 mg Oral Daily  . enoxaparin (LOVENOX) injection  1 mg/kg Subcutaneous Q12H  . insulin aspart  0-15 Units Subcutaneous TID WC  . insulin aspart  0-5 Units Subcutaneous QHS  . insulin detemir  10 Units Subcutaneous QHS  . magnesium oxide  400 mg Oral BID  . metoprolol succinate  50 mg Oral Daily  . multivitamin  1 tablet Oral Daily  . multivitamin with minerals  1 tablet Oral Daily  . sodium chloride  1,000 mL Intravenous Once  . sodium chloride flush  3 mL Intravenous Q12H  . vancomycin  1,000 mg Intravenous Q12H   . sodium chloride 50 mL/hr at 03/09/16 1146    Allergies:   Allergies  Allergen Reactions  . Penicillins Other (See Comments)    Reaction:  Unknown     Social History   Social History  . Marital Status: Married    Spouse Name: N/A  . Number of Children: N/A  . Years of Education: N/A   Occupational History  . Not on file.   Social History Main Topics  . Smoking status: Unknown If Ever Smoked  . Smokeless tobacco: Not on file  . Alcohol Use: Not on file  . Drug Use: Not on file  . Sexual Activity: Not on file   Other Topics Concern  . Not on file   Social History Narrative  . No narrative on file     History reviewed. No pertinent family history.   Review of Systems Positive forUnable to assess due to decreased sensorium   Labs:  Recent Labs  03/08/16 1357 03/08/16 1726 03/08/16 2326 03/09/16 0439  TROPONINI 0.06* 0.16* 0.08* 0.14*   Lab Results  Component Value Date   WBC 12.6* 03/09/2016   HGB 13.0 03/09/2016   HCT 38.8* 03/09/2016   MCV 92.4 03/09/2016   PLT 177 03/09/2016    Recent Labs Lab 03/08/16 1357 03/09/16 0439  NA 128* 134*  K 4.0 3.6  CL 99* 99*  CO2 14* 27  BUN 36* 34*  CREATININE 0.64 0.55*  CALCIUM 8.0* 9.4  PROT 5.6*  --   BILITOT 1.2  --   ALKPHOS 68  --   ALT 18  --   AST 31  --   GLUCOSE 515* 257*   No results found for: CHOL, HDL, LDLCALC, TRIG No results found for: DDIMER  Radiology/Studies:  Ct Abdomen Pelvis W Contrast  03/08/2016  ADDENDUM REPORT: 03/08/2016 19:21 ADDENDUM: Further imaging has now been obtained to evaluate the perineal and scrotal area. Mild scrotal thickening is noted although no abscess is seen. No other focal abnormality is seen. Electronically Signed   By: Inez Catalina M.D.   On: 03/08/2016 19:21  03/08/2016  CLINICAL DATA:  Incontinence stools and urine with left-sided swelling EXAM: CT ABDOMEN AND PELVIS WITH CONTRAST TECHNIQUE: Multidetector CT imaging of the abdomen and pelvis was performed using the standard protocol following bolus administration  of intravenous contrast. CONTRAST:  147mL ISOVUE-300 IOPAMIDOL (ISOVUE-300) INJECTION 61% COMPARISON:  None. FINDINGS: Lung bases demonstrate no evidence focal infiltrate or sizable effusion. There are however changes consistent with bilateral pulmonary emboli. The right ventricle is somewhat enlarged consistent with a degree of right heart strain. Some minimal atelectatic changes in the right lower lobe are seen. The liver, gallbladder, spleen, adrenal glands and pancreas are within normal limits. Kidneys are well visualized bilaterally and demonstrate bilateral renal cystic change. Prominence of the right renal collecting system is noted although no obstructing stone is seen. This is likely related to the over distension of the urinary bladder. Similar changes are noted on the left. Aortoiliac calcifications are identified. No pelvic mass lesion or sidewall abnormality is noted. Changes of prior prostate brachytherapy  are seen. The appendix is not visualized although no inflammatory changes are seen. The bony structures show degenerative change of the lumbar spine. No acute compression deformity is seen. Soft tissue swelling is noted in the left lower extremity likely related to deep venous thrombosis given the pulmonary emboli. Further evaluation by means of ultrasound can be performed as clinically indicated. By history there is a scrotal/perineal wound which was incompletely evaluated on this scan. Attempts to retrieve the patient for further scanning have been unsuccessful to this point. IMPRESSION: Bilateral lower lobe pulmonary emboli. There are changes signifying right heart strain consistent with increased morbidity and mortality. Bilateral renal cystic change. Significantly distended urinary bladder. This may reflect a degree of bladder outlet obstruction. Compensatory dilatation of the collecting systems is noted bilaterally. Left lower extremity swelling likely related to deep venous thrombosis. By  history there is a scrotal/perineal wound. Attempts to retrieve the patient for additional imaging were unsuccessful to this point due to Clinical needs on the floor. These results were called by telephone at the time of interpretation on 03/08/2016 at 6:06 pm to Dr. Valentino Nose , who verbally acknowledged these results. Electronically Signed: By: Inez Catalina M.D. On: 03/08/2016 18:14   Dg Chest Portable 1 View  03/08/2016  CLINICAL DATA:  Lower extremity edema. EXAM: PORTABLE CHEST 1 VIEW COMPARISON:  01/12/2009 FINDINGS: The heart size and mediastinal contours are within normal limits. Both lungs are clear. The visualized skeletal structures are unremarkable. IMPRESSION: No active disease. Electronically Signed   By: Kathreen Devoid   On: 03/08/2016 14:54    EKG: Atrial fibrillation with nonspecific ST and T-wave changes  Weights: Filed Weights   03/08/16 1355  Weight: 170 lb (77.111 kg)     Physical Exam: Blood pressure 104/60, pulse 79, temperature 97.4 F (36.3 C), temperature source Oral, resp. rate 18, height 5\' 11"  (1.803 m), weight 170 lb (77.111 kg), SpO2 99 %. Body mass index is 23.72 kg/(m^2). General: Well developed, well nourished, in no acute distress. Head eyes ears nose throat: Normocephalic, atraumatic, sclera non-icteric, no xanthomas, nares are without discharge. No apparent thyromegaly and/or mass  Lungs: Normal respiratory effort. Some wheezes, no rales, no rhonchi.  Heart: Irregular with normal S1 S2. no murmur gallop, no rub, PMI is normal size and placement, carotid upstroke normal without bruit, jugular venous pressure is normal Abdomen: Soft, non-tender, non-distended with normoactive bowel sounds. No hepatomegaly. No rebound/guarding. No obvious abdominal masses. Abdominal aorta is normal size without bruit Extremities: 1-2+ edema. no cyanosis, no clubbing, bilateral heel ulcers  Peripheral : 2+ bilateral upper extremity pulses, 2+ bilateral femoral pulses, 2+  bilateral dorsal pedal pulse Neuro: Not Alert and oriented. No facial asymmetry. No focal deficit. Moves all extremities spontaneously. Musculoskeletal: Normal muscle tone without kyphosis Psych:  Does not Responds to questions appropriately with a normal affect.    Assessment: 82 year old male with chronic nonvalvular atrial fibrillation.  Variable heart rates on appropriate medication management having elevated troponin possibly consistent with concerns of myocardial infarction but no evidence of heart failure or chest pain at this time  Plan: 1. Further investigation of possible concerns of cause of atrial fibrillation and decreased sensorium 2. Continue current medication regimen for heart rate control 3. Further consideration of anticoagulation depending on above 4. Echocardiogram for LV systolic dysfunction cardiovascular disease causing above 5. No change and high density cholesterol therapy 6. Further treatment options after about  Signed, Corey Skains M.D. [facility] 03/09/2016, 12:50 PM

## 2016-03-09 NOTE — Progress Notes (Signed)
Report from Amy RN. Patient resting quietly in bed. No complaints or distress - wound dressing recently changed and turned. No complaints or distress. Will continue to monitor.
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
## 2016-03-09 NOTE — NC FL2 (Signed)
[place] LEVEL OF CARE SCREENING TOOL     IDENTIFICATION  Patient Name: Paul Mcmahon Birthdate: Aug 08, 1925 Sex: male Admission Date (Current Location): 03/08/2016  [place] and [place] Number:  Engineering geologist and Address:  [facility], [address], [place], [place] [address]      Provider Number: [id number]  Attending Physician Name and Address:  Loletha Grayer, MD  Relative Name and Phone Number:       Current Level of Care: Hospital Recommended Level of Care: [place] Prior Approval Number:    Date Approved/Denied:   PASRR Number: [id number]  Discharge Plan: SNF    Current Diagnoses: Patient Active Problem List   Diagnosis Date Noted  . Sepsis ([place]) 03/08/2016  . Pressure ulcer 03/08/2016    Orientation RESPIRATION BLADDER Height & Weight     Self, Place  O2, Normal (2 liters) Incontinent Weight: 170 lb (77.111 kg) Height:  5\' 11"  (180.3 cm)  BEHAVIORAL SYMPTOMS/MOOD NEUROLOGICAL BOWEL NUTRITION STATUS   (none)  (none) Incontinent Diet (heart healthy)  AMBULATORY STATUS COMMUNICATION OF NEEDS Skin   Extensive Assist Verbally PU Stage and Appropriate Care                       Personal Care Assistance Level of Assistance  Bathing, Dressing Bathing Assistance: Maximum assistance   Dressing Assistance: Maximum assistance     Functional Limitations Info  Sight, Hearing Sight Info: Impaired Hearing Info: Impaired      SPECIAL CARE FACTORS FREQUENCY  PT (By licensed PT)                    Contractures Contractures Info: Not present    Additional Factors Info  Code Status Code Status Info: Full             Current Medications (03/09/2016):  This is the current hospital active medication list Current Facility-Administered Medications  Medication Dose Route Frequency Provider Last Rate Last Dose  . 0.9 %  sodium chloride infusion   Intravenous Continuous Loletha Grayer, MD 50 mL/hr at 03/09/16 1146    . acetaminophen (TYLENOL) tablet 650 mg  650 mg Oral Q6H PRN Lytle Butte, MD       Or  . acetaminophen (TYLENOL) suppository 650 mg  650 mg Rectal Q6H PRN Lytle Butte, MD      . aspirin EC tablet 81 mg  81 mg Oral Daily Lytle Butte, MD   81 mg at 03/09/16 0935  . atorvastatin (LIPITOR) tablet 10 mg  10 mg Oral QHS Lytle Butte, MD   10 mg at 03/08/16 2212  . ceFEPIme (MAXIPIME) 2 g in dextrose 5 % 50 mL IVPB  2 g Intravenous Q12H Ruby Cola, MD   2 g at 03/09/16 0618  . cholecalciferol (VITAMIN D) tablet 1,000 Units  1,000 Units Oral Daily Lytle Butte, MD   1,000 Units at 03/08/16 1730  . diatrizoate meglumine-sodium (GASTROGRAFIN) 66-10 % solution 15 mL  15 mL Oral Once Joanne Gavel, MD      . digoxin (LANOXIN) tablet 0.125 mg  0.125 mg Oral Daily Lytle Butte, MD   0.125 mg at 03/09/16 0935  . enoxaparin (LOVENOX) injection 75 mg  1 mg/kg Subcutaneous Q12H Lytle Butte, MD   75 mg at 03/09/16 0617  . insulin aspart (novoLOG) injection 0-15 Units  0-15 Units Subcutaneous TID WC Lytle Butte, MD  8 Units at 03/09/16 1317  . insulin aspart (novoLOG) injection 0-5 Units  0-5 Units Subcutaneous QHS Lytle Butte, MD   5 Units at 03/08/16 2213  . insulin detemir (LEVEMIR) injection 10 Units  10 Units Subcutaneous QHS Loletha Grayer, MD      . magnesium oxide (MAG-OX) tablet 400 mg  400 mg Oral BID Lytle Butte, MD   400 mg at 03/09/16 0935  . metoprolol succinate (TOPROL-XL) 24 hr tablet 50 mg  50 mg Oral Daily Lytle Butte, MD   50 mg at 03/09/16 0935  . morphine 2 MG/ML injection 2 mg  2 mg Intravenous Q4H PRN Lytle Butte, MD      . multivitamin (RENA-VIT) tablet 1 tablet  1 tablet Oral Daily Lytle Butte, MD   1 tablet at 03/08/16 1730  . multivitamin with minerals tablet 1 tablet  1 tablet Oral Daily Lytle Butte, MD   1 tablet at 03/08/16 1730  . ondansetron (ZOFRAN) tablet 4 mg  4 mg Oral Q6H PRN Lytle Butte, MD       Or  .  ondansetron [facility]) injection 4 mg  4 mg Intravenous Q6H PRN Lytle Butte, MD      . oxyCODONE (Oxy IR/ROXICODONE) immediate release tablet 5 mg  5 mg Oral Q4H PRN Lytle Butte, MD      . sodium chloride 0.9 % bolus 1,000 mL  1,000 mL Intravenous Once Lytle Butte, MD      . sodium chloride flush (NS) 0.9 % injection 3 mL  3 mL Intravenous Q12H Lytle Butte, MD   3 mL at 03/08/16 2214  . sodium hypochlorite (DAKIN'S 1/4 STRENGTH) topical solution   Irrigation Daily Loletha Grayer, MD      . vancomycin (VANCOCIN) IVPB 1000 mg/200 mL premix  1,000 mg Intravenous Q12H Lytle Butte, MD   1,000 mg at 03/09/16 [id number]     Discharge Medications: Please see discharge summary for a list of discharge medications.  Relevant Imaging Results:  Relevant Lab Results:   Additional Information SS: [id number]  Shela Leff, LCSW

## 2016-03-09 NOTE — Progress Notes (Signed)
PT Cancellation Note  Patient Details Name: Paul Mcmahon MRN: [id number] DOB: May 26, 1925   Cancelled Treatment:    Reason Eval/Treat Not Completed: Patient not medically ready;Medical issues which prohibited therapy. Pt's chart reviewed and discussed pt with RN. He is scheduled for a an echocardiogram due to elevated troponin and a venouse US of lower leg for possible blood clot. Pt is currently not appropriate to participate in therapy. PT will f/u at a later time and complete evaluation when appropriate.   Neoma Laming, PT, DPT  03/09/2016, 1:32 PM [phone]

## 2016-03-10 DIAGNOSIS — L089 Local infection of the skin and subcutaneous tissue, unspecified: Secondary | ICD-10-CM | POA: Insufficient documentation

## 2016-03-10 DIAGNOSIS — R6251 Failure to thrive (child): Secondary | ICD-10-CM | POA: Insufficient documentation

## 2016-03-10 DIAGNOSIS — I4891 Unspecified atrial fibrillation: Secondary | ICD-10-CM | POA: Insufficient documentation

## 2016-03-10 DIAGNOSIS — N390 Urinary tract infection, site not specified: Secondary | ICD-10-CM

## 2016-03-10 DIAGNOSIS — R627 Adult failure to thrive: Secondary | ICD-10-CM

## 2016-03-10 DIAGNOSIS — E43 Unspecified severe protein-calorie malnutrition: Secondary | ICD-10-CM | POA: Insufficient documentation

## 2016-03-10 DIAGNOSIS — L8990 Pressure ulcer of unspecified site, unspecified stage: Secondary | ICD-10-CM

## 2016-03-10 LAB — GLUCOSE, CAPILLARY
GLUCOSE-CAPILLARY: 294 mg/dL — AB (ref 65–99)
Glucose-Capillary: 319 mg/dL — ABNORMAL HIGH (ref 65–99)
Glucose-Capillary: 365 mg/dL — ABNORMAL HIGH (ref 65–99)
Glucose-Capillary: 374 mg/dL — ABNORMAL HIGH (ref 65–99)

## 2016-03-10 LAB — VANCOMYCIN, TROUGH: VANCOMYCIN TR: 16 ug/mL (ref 10–20)

## 2016-03-10 LAB — HEMOGLOBIN: HEMOGLOBIN: 11.5 g/dL — AB (ref 13.0–18.0)

## 2016-03-10 LAB — SEDIMENTATION RATE: Sed Rate: 49 mm/hr — ABNORMAL HIGH (ref 0–20)

## 2016-03-10 MED ORDER — INSULIN DETEMIR 100 UNIT/ML ~~LOC~~ SOLN
18.0000 [IU] | Freq: Every day | SUBCUTANEOUS | Status: DC
  Administered 2016-03-10: 18 [IU] via SUBCUTANEOUS
  Filled 2016-03-10 (×3): qty 0.18

## 2016-03-10 MED ORDER — TAMSULOSIN HCL 0.4 MG PO CAPS
0.4000 mg | ORAL_CAPSULE | Freq: Every day | ORAL | Status: DC
  Administered 2016-03-10 – 2016-03-14 (×4): 0.4 mg via ORAL
  Filled 2016-03-10 (×4): qty 1

## 2016-03-10 MED ORDER — INSULIN ASPART 100 UNIT/ML ~~LOC~~ SOLN
0.0000 [IU] | Freq: Three times a day (TID) | SUBCUTANEOUS | Status: DC
  Administered 2016-03-10: 20 [IU] via SUBCUTANEOUS
  Administered 2016-03-11: 15 [IU] via SUBCUTANEOUS
  Administered 2016-03-11: 7 [IU] via SUBCUTANEOUS
  Administered 2016-03-12: 20 [IU] via SUBCUTANEOUS
  Administered 2016-03-12: 7 [IU] via SUBCUTANEOUS
  Administered 2016-03-13: 11 [IU] via SUBCUTANEOUS
  Administered 2016-03-13: 20 [IU] via SUBCUTANEOUS
  Administered 2016-03-13: 3 [IU] via SUBCUTANEOUS
  Administered 2016-03-14: 4 [IU] via SUBCUTANEOUS
  Filled 2016-03-10: qty 7
  Filled 2016-03-10: qty 4
  Filled 2016-03-10 (×2): qty 20
  Filled 2016-03-10: qty 2
  Filled 2016-03-10: qty 7
  Filled 2016-03-10: qty 20
  Filled 2016-03-10: qty 15
  Filled 2016-03-10: qty 11

## 2016-03-10 NOTE — Progress Notes (Addendum)
Patient ID: Paul Mcmahon, male   DOB: 1925-07-07, 82 y.o.   MRN: [id number] [facility] PROGRESS NOTE  Paul Mcmahon [id number] DOB: 1924-11-18 DOA: 03/08/2016 PCP: Juluis Pitch, MD  HPI/Subjective: Patient states for a few days and at home he was hallucinating like in a dream state. He is feeling better now. He did not know how bad of shape he was in while he was at home. No abdominal pain or buttock pain.  Objective: Filed Vitals:   03/10/16 1113 03/10/16 1139  BP: 98/61 109/75  Pulse: 77 69  Temp: 97.6 F (36.4 C) 98 F (36.7 C)  Resp: 19 17    Filed Weights   03/08/16 1355 03/09/16 1646  Weight: 77.111 kg (170 lb) 72.774 kg (160 lb 7 oz)    ROS: Review of Systems  Constitutional: Negative for fever and chills.  Eyes: Negative for blurred vision.  Respiratory: Negative for cough and shortness of breath.   Cardiovascular: Negative for chest pain.  Gastrointestinal: Positive for constipation. Negative for nausea, vomiting, abdominal pain and diarrhea.  Genitourinary: Negative for dysuria.  Musculoskeletal: Negative for joint pain.  Neurological: Negative for dizziness and headaches.   Exam: Physical Exam  HENT:  Nose: No mucosal edema.  Mouth/Throat: No oropharyngeal exudate or posterior oropharyngeal edema.  Eyes: Conjunctivae, EOM and lids are normal. Pupils are equal, round, and reactive to light.  Neck: No JVD present. Carotid bruit is not present. No edema present. No thyroid mass and no thyromegaly present.  Cardiovascular: S1 normal and S2 normal.  Exam reveals no gallop.   No murmur heard. Pulses:      Dorsalis pedis pulses are 2+ on the right side, and 2+ on the left side.  Respiratory: No respiratory distress. He has no wheezes. He has no rhonchi. He has no rales.  GI: Soft. Bowel sounds are normal. There is no tenderness.  Musculoskeletal:       Right ankle: He exhibits swelling.       Left ankle: He exhibits swelling.  Lymphadenopathy:    He  has no cervical adenopathy.  Neurological: He is alert.  Skin: Skin is warm. Nails show no clubbing.  Left hip stage II decubiti skin tears. Stage III decubitus ulcer and perineum and extending into the scrotum.  Psychiatric: He has a normal mood and affect.      Data Reviewed: Basic Metabolic Panel:  Recent Labs Lab 03/08/16 1357 03/09/16 0439  NA 128* 134*  K 4.0 3.6  CL 99* 99*  CO2 14* 27  GLUCOSE 515* 257*  BUN 36* 34*  CREATININE 0.64 0.55*  CALCIUM 8.0* 9.4   Liver Function Tests:  Recent Labs Lab 03/08/16 1357  AST 31  ALT 18  ALKPHOS 68  BILITOT 1.2  PROT 5.6*  ALBUMIN 2.4*   CBC:  Recent Labs Lab 03/08/16 1357 03/09/16 0439  WBC 12.7* 12.6*  HGB 12.7* 13.0  HCT 40.4 38.8*  MCV 96.5 92.4  PLT 171 177   Cardiac Enzymes:  Recent Labs Lab 03/08/16 1357 03/08/16 1726 03/08/16 2326 03/09/16 0439  TROPONINI 0.06* 0.16* 0.08* 0.14*   BNP (last 3 results)  Recent Labs  03/08/16 1357  BNP 258.0*    CBG:  Recent Labs Lab 03/09/16 1640 03/09/16 1828 03/09/16 2124 03/10/16 0725 03/10/16 1200  GLUCAP 282* 250* 270* 319* 294*    Recent Results (from the past 240 hour(s))  Anaerobic culture     Status: None (Preliminary result)   Collection Time: 03/08/16  3:58 PM  Result Value Ref Range Status   Specimen Description BUTTOCKS  Final   Special Requests NONE  Final   Culture HOLDING FOR POSSIBLE ANAEROBE  Final   Report Status PENDING  Incomplete  Wound culture     Status: None (Preliminary result)   Collection Time: 03/08/16  3:58 PM  Result Value Ref Range Status   Specimen Description BUTTOCKS  Final   Special Requests NONE  Final   Gram Stain   Final    FEW WBC SEEN MANY GRAM POSITIVE COCCI MANY GRAM NEGATIVE RODS FEW GRAM POSITIVE RODS    Culture TRYING TO ISOLATE MULTIPLE ORGANISMS  Final   Report Status PENDING  Incomplete     Studies: Ct Abdomen Pelvis W Contrast  03/08/2016  ADDENDUM REPORT: 03/08/2016 19:21  ADDENDUM: Further imaging has now been obtained to evaluate the perineal and scrotal area. Mild scrotal thickening is noted although no abscess is seen. No other focal abnormality is seen. Electronically Signed   By: Inez Catalina M.D.   On: 03/08/2016 19:21  03/08/2016  CLINICAL DATA:  Incontinence stools and urine with left-sided swelling EXAM: CT ABDOMEN AND PELVIS WITH CONTRAST TECHNIQUE: Multidetector CT imaging of the abdomen and pelvis was performed using the standard protocol following bolus administration of intravenous contrast. CONTRAST:  184mL ISOVUE-300 IOPAMIDOL (ISOVUE-300) INJECTION 61% COMPARISON:  None. FINDINGS: Lung bases demonstrate no evidence focal infiltrate or sizable effusion. There are however changes consistent with bilateral pulmonary emboli. The right ventricle is somewhat enlarged consistent with a degree of right heart strain. Some minimal atelectatic changes in the right lower lobe are seen. The liver, gallbladder, spleen, adrenal glands and pancreas are within normal limits. Kidneys are well visualized bilaterally and demonstrate bilateral renal cystic change. Prominence of the right renal collecting system is noted although no obstructing stone is seen. This is likely related to the over distension of the urinary bladder. Similar changes are noted on the left. Aortoiliac calcifications are identified. No pelvic mass lesion or sidewall abnormality is noted. Changes of prior prostate brachytherapy are seen. The appendix is not visualized although no inflammatory changes are seen. The bony structures show degenerative change of the lumbar spine. No acute compression deformity is seen. Soft tissue swelling is noted in the left lower extremity likely related to deep venous thrombosis given the pulmonary emboli. Further evaluation by means of ultrasound can be performed as clinically indicated. By history there is a scrotal/perineal wound which was incompletely evaluated on this scan.  Attempts to retrieve the patient for further scanning have been unsuccessful to this point. IMPRESSION: Bilateral lower lobe pulmonary emboli. There are changes signifying right heart strain consistent with increased morbidity and mortality. Bilateral renal cystic change. Significantly distended urinary bladder. This may reflect a degree of bladder outlet obstruction. Compensatory dilatation of the collecting systems is noted bilaterally. Left lower extremity swelling likely related to deep venous thrombosis. By history there is a scrotal/perineal wound. Attempts to retrieve the patient for additional imaging were unsuccessful to this point due to Clinical needs on the floor. These results were called by telephone at the time of interpretation on 03/08/2016 at 6:06 pm to Dr. Valentino Nose , who verbally acknowledged these results. Electronically Signed: By: Inez Catalina M.D. On: 03/08/2016 18:14   US Venous Img Lower Bilateral  03/10/2016  CLINICAL DATA:  Pulmonary embolism. EXAM: BILATERAL LOWER EXTREMITY VENOUS DOPPLER ULTRASOUND TECHNIQUE: Gray-scale sonography with graded compression, as well as color Doppler and duplex ultrasound were performed  to evaluate the lower extremity deep venous systems from the level of the common femoral vein and including the common femoral, femoral, profunda femoral, popliteal and calf veins including the posterior tibial, peroneal and gastrocnemius veins when visible. The superficial great saphenous vein was also interrogated. Spectral Doppler was utilized to evaluate flow at rest and with distal augmentation maneuvers in the common femoral, femoral and popliteal veins. COMPARISON:  CT 03/08/2016.  Ultrasound 06/21/2005. FINDINGS: RIGHT LOWER EXTREMITY Common Femoral Vein: No evidence of thrombus. Normal compressibility, respiratory phasicity and response to augmentation. Saphenofemoral Junction: No evidence of thrombus. Normal compressibility and flow on color Doppler imaging.  Profunda Femoral Vein: No evidence of thrombus. Normal compressibility and flow on color Doppler imaging. Femoral Vein: No evidence of thrombus. Normal compressibility, respiratory phasicity and response to augmentation. Popliteal Vein: No evidence of thrombus. Normal compressibility, respiratory phasicity and response to augmentation. Calf Veins: No evidence of thrombus. Normal compressibility and flow on color Doppler imaging. Superficial Great Saphenous Vein: No evidence of thrombus. Normal compressibility and flow on color Doppler imaging. Other Findings:  None. LEFT LOWER EXTREMITY Common Femoral Vein: No evidence of thrombus. Normal compressibility, respiratory phasicity and response to augmentation. Saphenofemoral Junction: No evidence of thrombus. Normal compressibility and flow on color Doppler imaging. Profunda Femoral Vein: No evidence of thrombus. Normal compressibility and flow on color Doppler imaging. Femoral Vein: Nonocclusive thrombus noted. Popliteal Vein: No evidence of thrombus. Normal compressibility, respiratory phasicity and response to augmentation. Calf Veins: No evidence of thrombus. Normal compressibility and flow on color Doppler imaging. Superficial Great Saphenous Vein: No evidence of thrombus. Normal compressibility and flow on color Doppler imaging. Other Findings:  None. IMPRESSION: Nonocclusive thrombus in the left femoral vein. Electronically Signed   By: Marcello Moores  Register   On: 03/10/2016 06:56   Dg Chest Portable 1 View  03/08/2016  CLINICAL DATA:  Lower extremity edema. EXAM: PORTABLE CHEST 1 VIEW COMPARISON:  01/12/2009 FINDINGS: The heart size and mediastinal contours are within normal limits. Both lungs are clear. The visualized skeletal structures are unremarkable. IMPRESSION: No active disease. Electronically Signed   By: Kathreen Devoid   On: 03/08/2016 14:54    Scheduled Meds: . apixaban  10 mg Oral BID   Followed by  . [START ON 03/16/2016] apixaban  5 mg Oral BID   . aspirin EC  81 mg Oral Daily  . atorvastatin  10 mg Oral QHS  . ceFEPime (MAXIPIME) IV  2 g Intravenous Q12H  . cholecalciferol  1,000 Units Oral Daily  . diatrizoate meglumine-sodium  15 mL Oral Once  . digoxin  0.125 mg Oral Daily  . feeding supplement (ENSURE ENLIVE)  237 mL Oral TID PC  . insulin aspart  0-20 Units Subcutaneous TID WC  . insulin aspart  0-5 Units Subcutaneous QHS  . insulin detemir  18 Units Subcutaneous QHS  . magnesium oxide  400 mg Oral BID  . metoprolol succinate  50 mg Oral Daily  . multivitamin  1 tablet Oral Daily  . multivitamin with minerals  1 tablet Oral Daily  . sodium chloride  1,000 mL Intravenous Once  . sodium chloride flush  3 mL Intravenous Q12H  . sodium hypochlorite   Irrigation Daily  . vancomycin  1,000 mg Intravenous Q12H   Continuous Infusions: . sodium chloride 50 mL/hr at 03/10/16 0545    Assessment/Plan:  1. Clinical sepsis. Source could be sacral decubiti versus acute cystitis with hematuria. Patient has tachycardia and leukocytosis. Patient placed on aggressive antibiotics  with vancomycin and cefepime. Follow up cultures. Unfortunately no urine culture was sent on admission. 2. Sacral decubiti. Appreciate wound consultation. Continue IV antibiotics. Surgical consultation 3. Severe bladder distention Foley catheter placed. Start Flomax 4. Bilateral lower pulmonary embolism. Patient started on high-dose Lovenox I will switch over to eliquis. Sonogram of the lower extremities negative for dvt 5. Atrial fibrillation with rapid ventricular response. Patient started on digoxin.  6.   Elevated troponin. Likely demand ischemia from rapid ventricular response 7.   Essential hypertension continue usual medications 8.   Hyperlipidemia unspecified on atorvastatin 9. Type 2 diabetes poorly controlled increase the Levemir insulin to 18 units and high-dose sliding scale. 10. Weakness. Will need placement  Code Status:     Code Status  Orders        Start     Ordered   03/08/16 1556  Full code   Continuous     03/08/16 1557    Code Status History    Date Active Date Inactive Code Status Order ID Comments User Context   This patient has a current code status but no historical code status.    Advance Directive Documentation        Most Recent Value   Type of Advance Directive  Healthcare Power of Attorney, Living will   Pre-existing out of facility DNR order (yellow form or pink MOST form)     "MOST" Form in Place?       Family Communication: Spoke with family at bedside  Yesterday. Wife at bedside today Disposition Plan: Will need skilled nursing facility  Antibiotics:  Vancomycin  Cefepime  Time spent: 22 minutes  [place], [place]

## 2016-03-10 NOTE — Progress Notes (Addendum)
Dressing on sacral/perineal area changed. Old dressing saturated with blood pooling underneath. MD Dr. Jannifer Franklin notified, suggested checking hgb and notifying him of results. RN will continue to monitor. Rachael Fee, RN

## 2016-03-10 NOTE — Progress Notes (Signed)
Patient alert and oriented. VSS.  No complaints of pain.  Surgical doctor came in and cut away part of patient decub today.  Some moderate amount of bloody drainage coming from sacrum throughout the day.  Dressings done PRN.

## 2016-03-10 NOTE — Progress Notes (Signed)
Inpatient Diabetes Program Recommendations  AACE/ADA: New Consensus Statement on Inpatient Glycemic Control (2015)  Target Ranges:  Prepandial:   less than 140 mg/dL      Peak postprandial:   less than 180 mg/dL (1-2 hours)      Critically ill patients:  140 - 180 mg/dL  Results for Paul Mcmahon, Paul Mcmahon (MRN [id number]) as of 03/10/2016 08:43  Ref. Range 03/09/2016 07:38 03/09/2016 11:37 03/09/2016 16:40 03/09/2016 18:28 03/09/2016 21:24 03/10/2016 07:25  Glucose-Capillary Latest Ref Range: 65-99 mg/dL 239 (H) 259 (H) 282 (H) 250 (H) 270 (H) 319 (H)   Review of Glycemic Control  Diabetes history: DM2 Outpatient Diabetes medications: Metformin 750 mg TID (per hospital home medication list which is different than PCP office note on 03/02/16) Current orders for Inpatient glycemic control: Levemir 10 units QHS, Novolog 0-15 units TID with meals, Novolog 0-5 units QHS  Inpatient Diabetes Program Recommendations: Insulin - Basal: Please consider increasing Levemir to 18 units QHS (based on 72 kg x 0.25 units) Correction (SSI): Please consider increasing Novolog correction to Resistant scale. HgbA1C: A1C 13% on 03/08/16 indicating an average glucose of 326 mg/dl over the past 2-3 months.  Thanks, Barnie Alderman, RN, MSN, CDE Diabetes Coordinator Inpatient Diabetes Program [phone] (Team Pager from [place] to [place]) [phone] ([facility] office) [phone] [facility] office) [phone] [facility] office)

## 2016-03-10 NOTE — H&P (Signed)
Paul Mcmahon is an 82 y.o. male.   Chief Complaint: Sacral decubitus HPI:  82 yr old male who comes in with altered mental status sepsis sign of necrotic pressure ulcer on his sacrum. Patient states she's had one of these previously that got better couple of years ago. Patient states that he noticed a, back and started having some pain on his backside the past couple of weeks. Patient is alert at this time and wife is in the room with him. They have been doing dressing changes to the area with dry dressings. Patient did not have a fever at home but has had a progressively decreasing appetite and some weight loss over the past few months. Patient also has had a decline in functional status and is not able to rise from the bed but uses a lift if needed. Patient does not ever get into a wheelchair and move around or get out of his bed normally per the patient.  Past Medical History  Diagnosis Date  . Diabetes mellitus without complication ([place])   . MI (myocardial infarction) ([place])   . A-fib [facility])     History reviewed. No pertinent past surgical history.  History reviewed. No pertinent family history. Social History:  has no tobacco, alcohol, and drug history on file.  Allergies:  Allergies  Allergen Reactions  . Penicillins Other (See Comments)    Reaction:  Unknown     Medications Prior to Admission  Medication Sig Dispense Refill  . aspirin EC 81 MG tablet Take 81 mg by mouth daily.    Marland Kitchen atorvastatin (LIPITOR) 10 MG tablet Take 10 mg by mouth at bedtime.    Marland Kitchen b complex vitamins tablet Take 1 tablet by mouth daily.    . cholecalciferol (VITAMIN D) 1000 units tablet Take 1,000 Units by mouth daily.    . digoxin (LANOXIN) 0.125 MG tablet Take 0.125 mg by mouth daily.    Marland Kitchen edoxaban (SAVAYSA) 30 MG TABS tablet Take 30 mg by mouth daily.    . furosemide (LASIX) 20 MG tablet Take 20 mg by mouth daily.    . hydrochlorothiazide (MICROZIDE) 12.5 MG capsule Take 12.5 mg by mouth daily.    .  magnesium oxide (MAG-OX) 400 (241.3 Mg) MG tablet Take 400 mg by mouth 2 (two) times daily.    . metFORMIN (GLUCOPHAGE-XR) 750 MG 24 hr tablet Take 750 mg by mouth 3 (three) times daily.    . metoprolol succinate (TOPROL-XL) 50 MG 24 hr tablet Take 50 mg by mouth daily. Take with or immediately following a meal.    . Multiple Vitamin (MULTIVITAMIN WITH MINERALS) TABS tablet Take 1 tablet by mouth daily.      Results for orders placed or performed during the hospital encounter of 03/08/16 (from the past 48 hour(s))  Lactic acid, plasma     Status: Abnormal   Collection Time: 03/08/16  8:30 PM  Result Value Ref Range   Lactic Acid, Venous 4.5 (HH) 0.5 - 2.0 mmol/L    Comment: CRITICAL RESULT CALLED TO, READ BACK BY AND VERIFIED WITH CHARLOTTE KYEI  AT 2122 03/08/2016 BY TFK   Glucose, capillary     Status: Abnormal   Collection Time: 03/08/16  9:19 PM  Result Value Ref Range   Glucose-Capillary 356 (H) 65 - 99 mg/dL  Troponin I (q 6hr x 3)     Status: Abnormal   Collection Time: 03/08/16 11:26 PM  Result Value Ref Range   Troponin I 0.08 (H) <0.031  ng/mL    Comment: PREVIOUS RESULT CALLED TO OLIVIA BROOMER  AT 2683 ON 03/08/16 BY TFK/TLB        PERSISTENTLY INCREASED TROPONIN VALUES IN THE RANGE OF 0.04-0.49 ng/mL CAN BE SEEN IN:       -UNSTABLE ANGINA       -CONGESTIVE HEART FAILURE       -MYOCARDITIS       -CHEST TRAUMA       -ARRYHTHMIAS       -LATE PRESENTING MYOCARDIAL INFARCTION       -COPD   CLINICAL FOLLOW-UP RECOMMENDED.   Basic metabolic panel     Status: Abnormal   Collection Time: 03/09/16  4:39 AM  Result Value Ref Range   Sodium 134 (L) 135 - 145 mmol/L   Potassium 3.6 3.5 - 5.1 mmol/L   Chloride 99 (L) 101 - 111 mmol/L   CO2 27 22 - 32 mmol/L   Glucose, Bld 257 (H) 65 - 99 mg/dL   BUN 34 (H) 6 - 20 mg/dL   Creatinine, Ser 0.55 (L) 0.61 - 1.24 mg/dL   Calcium 9.4 8.9 - 10.3 mg/dL   GFR calc non Af Amer >60 >60 mL/min   GFR calc Af Amer >60 >60 mL/min     Comment: (NOTE) The eGFR has been calculated using the CKD EPI equation. This calculation has not been validated in all clinical situations. eGFR's persistently <60 mL/min signify possible Chronic Kidney Disease.    Anion gap 8 5 - 15  CBC     Status: Abnormal   Collection Time: 03/09/16  4:39 AM  Result Value Ref Range   WBC 12.6 (H) 3.8 - 10.6 K/uL   RBC 4.20 (L) 4.40 - 5.90 MIL/uL   Hemoglobin 13.0 13.0 - 18.0 g/dL   HCT 38.8 (L) 40.0 - 52.0 %   MCV 92.4 80.0 - 100.0 fL   MCH 31.0 26.0 - 34.0 pg   MCHC 33.5 32.0 - 36.0 g/dL   RDW 14.2 11.5 - 14.5 %   Platelets 177 150 - 440 K/uL  Troponin I (q 6hr x 3)     Status: Abnormal   Collection Time: 03/09/16  4:39 AM  Result Value Ref Range   Troponin I 0.14 (H) <0.031 ng/mL    Comment: PREVIOUS RESULT CALLED TO OLIVIA BROOMER ON 5/16/174 AT 1515 BY TFK/TLB        PERSISTENTLY INCREASED TROPONIN VALUES IN THE RANGE OF 0.04-0.49 ng/mL CAN BE SEEN IN:       -UNSTABLE ANGINA       -CONGESTIVE HEART FAILURE       -MYOCARDITIS       -CHEST TRAUMA       -ARRYHTHMIAS       -LATE PRESENTING MYOCARDIAL INFARCTION       -COPD   CLINICAL FOLLOW-UP RECOMMENDED.   Glucose, capillary     Status: Abnormal   Collection Time: 03/09/16  7:38 AM  Result Value Ref Range   Glucose-Capillary 239 (H) 65 - 99 mg/dL  Glucose, capillary     Status: Abnormal   Collection Time: 03/09/16 11:37 AM  Result Value Ref Range   Glucose-Capillary 259 (H) 65 - 99 mg/dL   Comment 1 Notify RN   Glucose, capillary     Status: Abnormal   Collection Time: 03/09/16  4:40 PM  Result Value Ref Range   Glucose-Capillary 282 (H) 65 - 99 mg/dL  Glucose, capillary     Status: Abnormal   Collection Time: 03/09/16  6:28 PM  Result Value Ref Range   Glucose-Capillary 250 (H) 65 - 99 mg/dL   Comment 1 Notify RN   Glucose, capillary     Status: Abnormal   Collection Time: 03/09/16  9:24 PM  Result Value Ref Range   Glucose-Capillary 270 (H) 65 - 99 mg/dL   Comment  1 Notify RN   Glucose, capillary     Status: Abnormal   Collection Time: 03/10/16  7:25 AM  Result Value Ref Range   Glucose-Capillary 319 (H) 65 - 99 mg/dL  Sedimentation rate     Status: Abnormal   Collection Time: 03/10/16 11:59 AM  Result Value Ref Range   Sed Rate 49 (H) 0 - 20 mm/hr  Glucose, capillary     Status: Abnormal   Collection Time: 03/10/16 12:00 PM  Result Value Ref Range   Glucose-Capillary 294 (H) 65 - 99 mg/dL  Glucose, capillary     Status: Abnormal   Collection Time: 03/10/16  5:13 PM  Result Value Ref Range   Glucose-Capillary 365 (H) 65 - 99 mg/dL   US Venous Img Lower Bilateral  03/10/2016  CLINICAL DATA:  Pulmonary embolism. EXAM: BILATERAL LOWER EXTREMITY VENOUS DOPPLER ULTRASOUND TECHNIQUE: Gray-scale sonography with graded compression, as well as color Doppler and duplex ultrasound were performed to evaluate the lower extremity deep venous systems from the level of the common femoral vein and including the common femoral, femoral, profunda femoral, popliteal and calf veins including the posterior tibial, peroneal and gastrocnemius veins when visible. The superficial great saphenous vein was also interrogated. Spectral Doppler was utilized to evaluate flow at rest and with distal augmentation maneuvers in the common femoral, femoral and popliteal veins. COMPARISON:  CT 03/08/2016.  Ultrasound 06/21/2005. FINDINGS: RIGHT LOWER EXTREMITY Common Femoral Vein: No evidence of thrombus. Normal compressibility, respiratory phasicity and response to augmentation. Saphenofemoral Junction: No evidence of thrombus. Normal compressibility and flow on color Doppler imaging. Profunda Femoral Vein: No evidence of thrombus. Normal compressibility and flow on color Doppler imaging. Femoral Vein: No evidence of thrombus. Normal compressibility, respiratory phasicity and response to augmentation. Popliteal Vein: No evidence of thrombus. Normal compressibility, respiratory phasicity and  response to augmentation. Calf Veins: No evidence of thrombus. Normal compressibility and flow on color Doppler imaging. Superficial Great Saphenous Vein: No evidence of thrombus. Normal compressibility and flow on color Doppler imaging. Other Findings:  None. LEFT LOWER EXTREMITY Common Femoral Vein: No evidence of thrombus. Normal compressibility, respiratory phasicity and response to augmentation. Saphenofemoral Junction: No evidence of thrombus. Normal compressibility and flow on color Doppler imaging. Profunda Femoral Vein: No evidence of thrombus. Normal compressibility and flow on color Doppler imaging. Femoral Vein: Nonocclusive thrombus noted. Popliteal Vein: No evidence of thrombus. Normal compressibility, respiratory phasicity and response to augmentation. Calf Veins: No evidence of thrombus. Normal compressibility and flow on color Doppler imaging. Superficial Great Saphenous Vein: No evidence of thrombus. Normal compressibility and flow on color Doppler imaging. Other Findings:  None. IMPRESSION: Nonocclusive thrombus in the left femoral vein. Electronically Signed   By: Marcello Moores  Register   On: 03/10/2016 06:56    Review of Systems  Constitutional: Positive for fever, chills, weight loss and malaise/fatigue.  HENT: Negative for congestion.   Respiratory: Negative for cough, sputum production, shortness of breath and wheezing.   Cardiovascular: Negative for chest pain, palpitations and leg swelling.  Gastrointestinal: Negative for heartburn, abdominal pain and constipation.  Genitourinary: Negative for dysuria and hematuria.  Musculoskeletal: Negative for back pain.  Skin: Negative  for itching and rash.  Neurological: Positive for weakness. Negative for dizziness, loss of consciousness and headaches.  Psychiatric/Behavioral: The patient is not nervous/anxious.   All other systems reviewed and are negative.   Blood pressure 109/75, pulse 69, temperature 98 F (36.7 C), temperature source  Oral, resp. rate 17, height 5' 11"  (1.803 m), weight 160 lb 7 oz (72.774 kg), SpO2 100 %. Physical Exam  Vitals reviewed. Constitutional: He is oriented to person, place, and time. He appears well-developed and well-nourished. No distress.  HENT:  Head: Normocephalic and atraumatic.  Right Ear: External ear normal.  Left Ear: External ear normal.  Nose: Nose normal.  Mouth/Throat: Oropharynx is clear and moist. No oropharyngeal exudate.  Eyes: Conjunctivae and EOM are normal. Pupils are equal, round, and reactive to light. No scleral icterus.  Neck: Normal range of motion. Neck supple. No tracheal deviation present.  Cardiovascular: Normal rate, regular rhythm, normal heart sounds and intact distal pulses.  Exam reveals no gallop and no friction rub.   No murmur heard. Respiratory: Effort normal and breath sounds normal. No respiratory distress. He has no wheezes.  GI: Soft. Bowel sounds are normal. He exhibits no distension. There is no tenderness.  Neurological: He is alert and oriented to person, place, and time. No cranial nerve deficit.  Skin: Skin is warm.  9x5 cm area of sacral decubitus with eschar, necrotic material debrided at bedside with pink tissue beneath, small area of pressure ulcer to posterior scrotum and stage III ulcer superior on sacrum 4x2cm in size  Psychiatric: He has a normal mood and affect. Judgment and thought content normal.     Assessment/Plan 81 yr old male with necrotic sacral decubitus ulcer, the area was sharply debrided at bedside to take away necrotic tissue from the skin and subcutaneous area there is pink tissue beneath and some bleeding. I would recommend Dakin's dressings to this area as well as to the stage III ulcerations more superiorly on the sacrum. I would also recommend that offloading and high-protein calorie diet. Surgery will continue to follow to ensure no more debridement needed.  Hubbard Robinson, MD 03/10/2016, 8:01 PM

## 2016-03-10 NOTE — Progress Notes (Signed)
CSW attempted to present bed offers to patient and his family. Patient was asleep and there was no family at bedside. CSW left SNF list with bed offers highlighted and left a voicemail for [employer]. CSW is awaiting phone call back. CSW will continue to follow and assist.   Ernest Pine, MSW, [place] Work Department [phone]

## 2016-03-10 NOTE — Progress Notes (Signed)
Pharmacy Antibiotic Note  Paul Mcmahon is a 82 y.o. male admitted on 03/08/2016 with wound infection with decubitus ulcer.  Pharmacy has been consulted for Vancomycin and Cefepime dosing.  Plan: Pk parameters:  Kel (hr-1): 0.061 Half-life (hrs): 11.36 Vd (liters): 53.90 (factor used: 0.7 L/kg)  Patient received Vancomycin 1 g Iv x 1. Will start Vancomycin 1 g IV q12 hours starting @ 20:00. Will order Vancomycin trough level ordered for 19:00 on 5/18   VT=16 continue vancomycin 1g q 12 hours. Recheck level in 1 week if needed.  Height: 5\' 11"  (180.3 cm) Weight: 160 lb 7 oz (72.774 kg) IBW/kg (Calculated) : 75.3  Temp (24hrs), Avg:97.7 F (36.5 C), Min:97.5 F (36.4 C), Max:98 F (36.7 C)   Recent Labs Lab 03/08/16 1357 03/08/16 1726 03/08/16 2030 03/09/16 0439 03/10/16 1928  WBC 12.7*  --   --  12.6*  --   CREATININE 0.64  --   --  0.55*  --   LATICACIDVEN  --  2.9* 4.5*  --   --   VANCOTROUGH  --   --   --   --  16    Estimated Creatinine Clearance: 65.7 mL/min (by C-G formula based on Cr of 0.55).    Allergies  Allergen Reactions  . Penicillins Other (See Comments)    Reaction:  Unknown     Antimicrobials this admission: Vancomycin  5/16 >>  Cefepime 5/16  >>   Dose adjustments this admission:   Microbiology results:  BCx:   UCx:    Sputum:    MRSA PCR:   Thank you for allowing pharmacy to be a part of this patient's care.  Ramond Dial 03/10/2016 9:06 PM

## 2016-03-10 NOTE — Evaluation (Signed)
Physical Therapy Evaluation Patient Details Name: Paul Mcmahon MRN: [id number] DOB: August 17, 1925 Today's Date: 03/10/2016   History of Present Illness  82 y/o male with 4 months as consistent functional decline, he is admitted with sepsis.  Pt is profounfly weak, has multiple pressure ulcers and generally has been extremely functionally limited and essentially bed bound for 2+ months.  Clinical Impression  Pt with profound weakness and generally has been chair bound (sleeps in lift chair) needing heavier and heavier assist with BSC transfers and ultimately getting bed sores and worsening health.  Pt's wife with dementia and unable to assist with care, son has been helping 3-4 hours/day and has seen pt getting weaker and weaker for months.  Pt admits that he has been stubborn and "stupid" but is ready to start working on building some strength/mobility.     Follow Up Recommendations SNF    Equipment Recommendations       Recommendations for Other Services       Precautions / Restrictions Precautions Precautions: Fall Restrictions Weight Bearing Restrictions: No      Mobility  Bed Mobility Overal bed mobility: Needs Assistance Bed Mobility: Supine to Sit;Sit to Supine     Supine to sit: Mod assist;Max assist Sit to supine: Max assist;Total assist   General bed mobility comments: Pt is able to maintain static sitting balance at EOB once assisted to position, but is very weak and shows little confidence or tolerance  Transfers                 General transfer comment: Attempted to get to standing and pt completely unable to assist even from raised bed.  Pt severely limited with ability to stand/transfer.  Ambulation/Gait                Stairs            Wheelchair Mobility    Modified Rankin (Stroke Patients Only)       Balance                                             Pertinent Vitals/Pain Pain Assessment:  (only vague  sacral (ulcer) and LE pain)    Home Living Family/patient expects to be discharged to:: Skilled nursing facility Living Arrangements: Spouse/significant other (wife with dementia, son is with them QD 3-4 hrs)                    Prior Function Level of Independence: Needs assistance   Gait / Transfers Assistance Needed: Pt needs assist from son getting to [facility], he has not walked in 2-4 months.     Comments: Pt has apparently been functionally limited for years but has had a steep decline in the last 2-4 months     Hand Dominance        Extremity/Trunk Assessment   Upper Extremity Assessment: Generalized weakness (almost no grip b/l, no L shoulder mvt & v. limited R)           Lower Extremity Assessment: Generalized weakness (profound weakness b/l, grossly 2/5 )         Communication   Communication: No difficulties  Cognition Arousal/Alertness: Awake/alert Behavior During Therapy: WFL for tasks assessed/performed Overall Cognitive Status: History of cognitive impairments - at baseline  General Comments      Exercises        Assessment/Plan    PT Assessment Patient needs continued PT services  PT Diagnosis Generalized weakness;Difficulty walking   PT Problem List Decreased strength;Decreased activity tolerance;Decreased balance;Decreased mobility;Decreased coordination;Decreased knowledge of use of DME;Decreased safety awareness;Decreased skin integrity  PT Treatment Interventions Gait training;DME instruction;Functional mobility training;Therapeutic activities;Therapeutic exercise;Balance training;Neuromuscular re-education;Patient/family education;Wheelchair mobility training   PT Goals (Current goals can be found in the Care Plan section) Acute Rehab PT Goals Patient Stated Goal: start doing something to get stronger PT Goal Formulation: With patient/family Time For Goal Achievement: 03/24/16 Potential to Achieve Goals:  Fair    Frequency Min 2X/week   Barriers to discharge        Co-evaluation               End of Session Equipment Utilized During Treatment: Gait belt Activity Tolerance: Patient limited by fatigue Patient left: with bed alarm set;with chair alarm set;with family/visitor present           Time: [id number] PT Time Calculation (min) (ACUTE ONLY): 28 min   Charges:   PT Evaluation $PT Eval Moderate Complexity: 1 Procedure     PT G Codes:        Kreg Shropshire, DPT 03/10/2016, 11:18 AM

## 2016-03-11 ENCOUNTER — Inpatient Hospital Stay: Payer: Medicare Other

## 2016-03-11 ENCOUNTER — Encounter
Admission: EM | Disposition: A | Discharge status: Skilled Nursing Facility | Payer: Self-pay | Source: Home / Self Care | Attending: Internal Medicine

## 2016-03-11 HISTORY — PX: PERIPHERAL VASCULAR CATHETERIZATION: SHX172C

## 2016-03-11 LAB — GLUCOSE, CAPILLARY
GLUCOSE-CAPILLARY: 331 mg/dL — AB (ref 65–99)
Glucose-Capillary: 81 mg/dL (ref 65–99)
Glucose-Capillary: 231 mg/dL — ABNORMAL HIGH (ref 65–99)
Glucose-Capillary: 245 mg/dL — ABNORMAL HIGH (ref 65–99)

## 2016-03-11 LAB — CBC
HCT: 30.5 % — ABNORMAL LOW (ref 40.0–52.0)
Hemoglobin: 10.3 g/dL — ABNORMAL LOW (ref 13.0–18.0)
MCH: 31.1 pg (ref 26.0–34.0)
MCHC: 33.7 g/dL (ref 32.0–36.0)
MCV: 92.4 fL (ref 80.0–100.0)
PLATELETS: 172 10*3/uL (ref 150–440)
RBC: 3.3 MIL/uL — AB (ref 4.40–5.90)
RDW: 14.4 % (ref 11.5–14.5)
WBC: 11.1 10*3/uL — ABNORMAL HIGH (ref 3.8–10.6)

## 2016-03-11 SURGERY — IVC FILTER INSERTION
Anesthesia: Moderate Sedation

## 2016-03-11 MED ORDER — IOPAMIDOL (ISOVUE-370) INJECTION 76%
100.0000 mL | Freq: Once | INTRAVENOUS | Status: AC | PRN
  Administered 2016-03-11: 100 mL via INTRAVENOUS

## 2016-03-11 MED ORDER — MIDAZOLAM HCL 2 MG/2ML IJ SOLN
INTRAMUSCULAR | Status: AC
  Filled 2016-03-11: qty 2

## 2016-03-11 MED ORDER — IOPAMIDOL (ISOVUE-300) INJECTION 61%
INTRAVENOUS | Status: DC | PRN
  Administered 2016-03-11: 15 mL via INTRAVENOUS

## 2016-03-11 MED ORDER — DEXTROSE 5 % IV SOLN
2.0000 g | Freq: Two times a day (BID) | INTRAVENOUS | Status: DC
  Administered 2016-03-11 – 2016-03-13 (×4): 2 g via INTRAVENOUS
  Filled 2016-03-11 (×6): qty 2

## 2016-03-11 MED ORDER — DEXTROSE 5 % IV SOLN
2.0000 g | Freq: Three times a day (TID) | INTRAVENOUS | Status: DC
  Filled 2016-03-11 (×2): qty 2

## 2016-03-11 MED ORDER — LIDOCAINE-EPINEPHRINE (PF) 1 %-1:200000 IJ SOLN
INTRAMUSCULAR | Status: AC
  Filled 2016-03-11: qty 30

## 2016-03-11 MED ORDER — INSULIN DETEMIR 100 UNIT/ML ~~LOC~~ SOLN
15.0000 [IU] | Freq: Every day | SUBCUTANEOUS | Status: DC
  Administered 2016-03-11: 15 [IU] via SUBCUTANEOUS
  Filled 2016-03-11 (×2): qty 0.15

## 2016-03-11 MED ORDER — CHLORHEXIDINE GLUCONATE CLOTH 2 % EX PADS
6.0000 | MEDICATED_PAD | Freq: Once | CUTANEOUS | Status: AC
  Administered 2016-03-11: 6 via TOPICAL

## 2016-03-11 MED ORDER — MIDAZOLAM HCL 2 MG/2ML IJ SOLN
INTRAMUSCULAR | Status: DC | PRN
  Administered 2016-03-11: 2 mg via INTRAVENOUS

## 2016-03-11 MED ORDER — HEPARIN (PORCINE) IN NACL 2-0.9 UNIT/ML-% IJ SOLN
INTRAMUSCULAR | Status: AC
  Filled 2016-03-11: qty 1000

## 2016-03-11 MED ORDER — SODIUM CHLORIDE 0.9 % IV SOLN
INTRAVENOUS | Status: DC
  Administered 2016-03-11: 16:00:00 via INTRAVENOUS

## 2016-03-11 MED ORDER — CLINDAMYCIN PHOSPHATE 300 MG/50ML IV SOLN
INTRAVENOUS | Status: AC
  Administered 2016-03-11: 16:00:00
  Filled 2016-03-11: qty 50

## 2016-03-11 SURGICAL SUPPLY — 3 items
FILTER VC CELECT-FEMORAL (Filter) ×1 IMPLANT
PACK ANGIOGRAPHY (CUSTOM PROCEDURE TRAY) ×1 IMPLANT
WIRE J 3MM .035X145CM (WIRE) ×1 IMPLANT

## 2016-03-11 NOTE — Progress Notes (Signed)
Pharmacy Antibiotic Note  Paul Mcmahon is a 82 y.o. male admitted on 03/08/2016 with wound infection with decubitus ulcer.  Pharmacy has been consulted for Vancomycin and Cefepime dosing.  This is currently day # 4 of antibiotics.  Plan: Continue cefepime 2 g IV q12h Continue vancomycin 1000 mg IV q12h Goal vancomycin trough 15-20 mcg/mL Vancomycin trough scheduled for 5/25 @ 1930  Height: 5\' 11"  (180.3 cm) Weight: 160 lb 7 oz (72.774 kg) IBW/kg (Calculated) : 75.3  Temp (24hrs), Avg:97.8 F (36.6 C), Min:97.5 F (36.4 C), Max:98.2 F (36.8 C)   Recent Labs Lab 03/08/16 1357 03/08/16 1726 03/08/16 2030 03/09/16 0439 03/10/16 1928  WBC 12.7*  --   --  12.6*  --   CREATININE 0.64  --   --  0.55*  --   LATICACIDVEN  --  2.9* 4.5*  --   --   VANCOTROUGH  --   --   --   --  16    Estimated Creatinine Clearance: 65.7 mL/min (by C-G formula based on Cr of 0.55).    Allergies  Allergen Reactions  . Penicillins Other (See Comments)    Reaction:  Unknown    Antimicrobials this admission: Vancomycin  5/16 >>  Cefepime 5/16  >>   Dose adjustments this admission: 5/18 VT = 16 mcg/mL. Continue same dose of vancomycin 1000 mg IV q12h  Microbiology results:  BCx: No growth 2 days  UCx: Not obtained  Wound: Multiple organisms, pending    Thank you for allowing pharmacy to be a part of this patient's care.  Lenis Noon, PharmD Clinical Pharmacist 03/11/2016 10:49 AM

## 2016-03-11 NOTE — Care Management Important Message (Signed)
Important Message  Patient Details  Name: Paul Mcmahon MRN: [id number] Date of Birth: 1924/12/07   Medicare Important Message Given:       Darius Bump Vaeda Westall 03/11/2016, 12:39 PM

## 2016-03-11 NOTE — Progress Notes (Signed)
Patient ID: Paul Mcmahon, male   DOB: November 23, 1924, 82 y.o.   MRN: [id number] [facility] PROGRESS NOTE  Paul Mcmahon DOB: 1925/08/14 DOA: 03/08/2016 PCP: Juluis Pitch, MD  HPI/Subjective: Patient physically feels okay. Offers no complaints. As per the nurse, they had to change his dressing twice overnight secondary to bleeding from the decubitus debridement.  Objective: Filed Vitals:   03/10/16 2010 03/11/16 0538  BP: 125/79 110/67  Pulse: 88 72  Temp: 97.5 F (36.4 C) 98.2 F (36.8 C)  Resp: 18 18    Filed Weights   03/08/16 1355 03/09/16 1646  Weight: 77.111 kg (170 lb) 72.774 kg (160 lb 7 oz)    ROS: Review of Systems  Constitutional: Negative for fever and chills.  Eyes: Negative for blurred vision.  Respiratory: Negative for cough and shortness of breath.   Cardiovascular: Negative for chest pain.  Gastrointestinal: Positive for constipation. Negative for nausea, vomiting, abdominal pain and diarrhea.  Genitourinary: Negative for dysuria.  Musculoskeletal: Negative for joint pain.  Neurological: Negative for dizziness and headaches.   Exam: Physical Exam  HENT:  Nose: No mucosal edema.  Mouth/Throat: No oropharyngeal exudate or posterior oropharyngeal edema.  Eyes: Conjunctivae, EOM and lids are normal. Pupils are equal, round, and reactive to light.  Neck: No JVD present. Carotid bruit is not present. No edema present. No thyroid mass and no thyromegaly present.  Cardiovascular: S1 normal and S2 normal.  Exam reveals no gallop.   No murmur heard. Pulses:      Dorsalis pedis pulses are 2+ on the right side, and 2+ on the left side.  Respiratory: No respiratory distress. He has no wheezes. He has no rhonchi. He has no rales.  GI: Soft. Bowel sounds are normal. There is no tenderness.  Musculoskeletal:       Right ankle: He exhibits swelling.       Left ankle: He exhibits swelling.  Lymphadenopathy:    He has no cervical adenopathy.   Neurological: He is alert.  Skin: Skin is warm. Nails show no clubbing.  Bright red blood on the patient's sheets and blanket  Psychiatric: He has a normal mood and affect.      Data Reviewed: Basic Metabolic Panel:  Recent Labs Lab 03/08/16 1357 03/09/16 0439  NA 128* 134*  K 4.0 3.6  CL 99* 99*  CO2 14* 27  GLUCOSE 515* 257*  BUN 36* 34*  CREATININE 0.64 0.55*  CALCIUM 8.0* 9.4   Liver Function Tests:  Recent Labs Lab 03/08/16 1357  AST 31  ALT 18  ALKPHOS 68  BILITOT 1.2  PROT 5.6*  ALBUMIN 2.4*   CBC:  Recent Labs Lab 03/08/16 1357 03/09/16 0439 03/10/16 2326  WBC 12.7* 12.6*  --   HGB 12.7* 13.0 11.5*  HCT 40.4 38.8*  --   MCV 96.5 92.4  --   PLT 171 177  --    Cardiac Enzymes:  Recent Labs Lab 03/08/16 1357 03/08/16 1726 03/08/16 2326 03/09/16 0439  TROPONINI 0.06* 0.16* 0.08* 0.14*   BNP (last 3 results)  Recent Labs  03/08/16 1357  BNP 258.0*    CBG:  Recent Labs Lab 03/10/16 0725 03/10/16 1200 03/10/16 1713 03/10/16 2014 03/11/16 0726  GLUCAP 319* 294* 365* 374* 81    Recent Results (from the past 240 hour(s))  Culture, blood (Routine X 2) w Reflex to ID Panel     Status: None (Preliminary result)   Collection Time: 03/08/16  2:40 PM  Result  Value Ref Range Status   Specimen Description BLOOD LEFT WRIST  Final   Special Requests   Final    BOTTLES DRAWN AEROBIC AND ANAEROBIC  AERO 10CC ANA 2CC   Culture NO GROWTH 2 DAYS  Final   Report Status PENDING  Incomplete  Culture, blood (Routine X 2) w Reflex to ID Panel     Status: None (Preliminary result)   Collection Time: 03/08/16  2:43 PM  Result Value Ref Range Status   Specimen Description BLOOD LEFT ASSIST CONTROL  Final   Special Requests   Final    BOTTLES DRAWN AEROBIC AND ANAEROBIC  AERO 10CC ANA 8CC   Culture NO GROWTH 2 DAYS  Final   Report Status PENDING  Incomplete  Anaerobic culture     Status: None (Preliminary result)   Collection Time: 03/08/16   3:58 PM  Result Value Ref Range Status   Specimen Description BUTTOCKS  Final   Special Requests NONE  Final   Culture HOLDING FOR POSSIBLE ANAEROBE  Final   Report Status PENDING  Incomplete  Wound culture     Status: None (Preliminary result)   Collection Time: 03/08/16  3:58 PM  Result Value Ref Range Status   Specimen Description BUTTOCKS  Final   Special Requests NONE  Final   Gram Stain   Final    FEW WBC SEEN MANY GRAM POSITIVE COCCI MANY GRAM NEGATIVE RODS FEW GRAM POSITIVE RODS    Culture   Final    MULTIPLE ORGANISMS PRESENT, NONE PREDOMINANT IDENTIFICATION TO FOLLOW    Report Status PENDING  Incomplete     Studies: US Venous Img Lower Bilateral  03/10/2016  CLINICAL DATA:  Pulmonary embolism. EXAM: BILATERAL LOWER EXTREMITY VENOUS DOPPLER ULTRASOUND TECHNIQUE: Gray-scale sonography with graded compression, as well as color Doppler and duplex ultrasound were performed to evaluate the lower extremity deep venous systems from the level of the common femoral vein and including the common femoral, femoral, profunda femoral, popliteal and calf veins including the posterior tibial, peroneal and gastrocnemius veins when visible. The superficial great saphenous vein was also interrogated. Spectral Doppler was utilized to evaluate flow at rest and with distal augmentation maneuvers in the common femoral, femoral and popliteal veins. COMPARISON:  CT 03/08/2016.  Ultrasound 06/21/2005. FINDINGS: RIGHT LOWER EXTREMITY Common Femoral Vein: No evidence of thrombus. Normal compressibility, respiratory phasicity and response to augmentation. Saphenofemoral Junction: No evidence of thrombus. Normal compressibility and flow on color Doppler imaging. Profunda Femoral Vein: No evidence of thrombus. Normal compressibility and flow on color Doppler imaging. Femoral Vein: No evidence of thrombus. Normal compressibility, respiratory phasicity and response to augmentation. Popliteal Vein: No evidence of  thrombus. Normal compressibility, respiratory phasicity and response to augmentation. Calf Veins: No evidence of thrombus. Normal compressibility and flow on color Doppler imaging. Superficial Great Saphenous Vein: No evidence of thrombus. Normal compressibility and flow on color Doppler imaging. Other Findings:  None. LEFT LOWER EXTREMITY Common Femoral Vein: No evidence of thrombus. Normal compressibility, respiratory phasicity and response to augmentation. Saphenofemoral Junction: No evidence of thrombus. Normal compressibility and flow on color Doppler imaging. Profunda Femoral Vein: No evidence of thrombus. Normal compressibility and flow on color Doppler imaging. Femoral Vein: Nonocclusive thrombus noted. Popliteal Vein: No evidence of thrombus. Normal compressibility, respiratory phasicity and response to augmentation. Calf Veins: No evidence of thrombus. Normal compressibility and flow on color Doppler imaging. Superficial Great Saphenous Vein: No evidence of thrombus. Normal compressibility and flow on color Doppler imaging. Other Findings:  None. IMPRESSION: Nonocclusive thrombus in the left femoral vein. Electronically Signed   By: [facility]   On: 03/10/2016 06:56    Scheduled Meds: . atorvastatin  10 mg Oral QHS  . ceFEPime (MAXIPIME) IV  2 g Intravenous Q12H  . cholecalciferol  1,000 Units Oral Daily  . diatrizoate meglumine-sodium  15 mL Oral Once  . digoxin  0.125 mg Oral Daily  . feeding supplement (ENSURE ENLIVE)  237 mL Oral TID PC  . insulin aspart  0-20 Units Subcutaneous TID WC  . insulin aspart  0-5 Units Subcutaneous QHS  . insulin detemir  15 Units Subcutaneous QHS  . magnesium oxide  400 mg Oral BID  . metoprolol succinate  50 mg Oral Daily  . multivitamin  1 tablet Oral Daily  . multivitamin with minerals  1 tablet Oral Daily  . sodium chloride  1,000 mL Intravenous Once  . sodium chloride flush  3 mL Intravenous Q12H  . sodium hypochlorite   Irrigation Daily  .  tamsulosin  0.4 mg Oral QPC supper  . vancomycin  1,000 mg Intravenous Q12H    Assessment/Plan:  1. Clinical sepsis. Source could be sacral decubiti versus acute cystitis with hematuria. Patient placed on aggressive antibiotics with vancomycin and cefepime. I will switch cefepime over to ceftazidime. Unfortunately no urine culture sent on presentation. Wound culture growing 5 organisms and final results will not be back until tomorrow. 2. Acute hemorrhagic anemia. Need to stop blood thinners at this point. Serial hemoglobins may end up needing a transfusion at some point 3. Sacral decubiti. status post bedside debridement. Continue antibiotics  4. Severe bladder distention Foley catheter placed. Flomax. 5. Bilateral lower pulmonary embolism seen on CT scan of the abdomen. I spoke with radiologist on call and he is definite that these were pulmonary embolism. Could be chronic in nature. He did see some right heart strain. I will get a CT scan of the chest for further evaluation. I need to stop anticoagulation until bleeding stops. Ultrasound the lower extremities were negative for DVT.  6. Atrial fibrillation with rapid ventricular response. Patient started on digoxin.  6.   Elevated troponin. Likely demand ischemia from rapid ventricular response 7.   Essential hypertension continue usual medications 8.   Hyperlipidemia unspecified on atorvastatin 9.   Type 2 diabetes with better sugar this morning. I will cut back the Levemir insulin to 15 units and high-dose sliding scale. 10. Weakness. Will need placement  Code Status:     Code Status Orders        Start     Ordered   03/08/16 1556  Full code   Continuous     03/08/16 1557    Code Status History    Date Active Date Inactive Code Status Order ID Comments User Context   This patient has a current code status but no historical code status.    Advance Directive Documentation        Most Recent Value   Type of Advance Directive   Healthcare Power of Attorney, Living will   Pre-existing out of facility DNR order (yellow form or pink MOST form)     "MOST" Form in Place?       Family Communication: Left message for sun  Disposition Plan: Will need skilled nursing facility  Antibiotics:  Vancomycin  Cefepime changed to ceftazidime   Time spent: 25 minutes  [place], [place]

## 2016-03-11 NOTE — Progress Notes (Addendum)
CSW called patient's son Marden Noble at [phone]. He reports that his cell phone is dead and he does not have a Games developer. Reported that he can be contacted at his parent's phone. CSW presented bed offers [facility] and [facility]). He reports that he did not want those facilities. Requested CSW to follow up with [facility]. CSW informed him that she would follow up with [facility]. CSW contacted [facility] and requested he review patient. Per [facility] is currently full. Stated that if he will have a bed next week. Reported he'd review patient's documents and determine if he can accept patient. Awaiting call back from [place].   Per Broadus John- Admissions Coordinator with [facility] he can accept [facility] at [facility] pending bed availability next week. CSW attempted to inform Marden Noble he was not at home. CSW left voicemail on his cell phone. CSW informed patient and MD [place] of above. CSW will continue to follow and assist.   Ernest Pine, MSW, [place] Work Department [phone]

## 2016-03-11 NOTE — Progress Notes (Signed)
Per MD Wieting ok to order an air bed

## 2016-03-11 NOTE — Progress Notes (Signed)
Pt saturated another dressing. Hgb resulted at 11.5. MD Dr. Jannifer Franklin notified per request. Suggested to call on call surgeon. MD Dr. Adonis Huguenin notified, coming to assess pt. RN will continue to monitor and treat per MD orders. Rachael Fee, RN

## 2016-03-11 NOTE — Consult Note (Signed)
[facility] Consult Note  MRN : [id number]  Paul Mcmahon is a 82 y.o. (01/10/1925) male who presents with chief complaint of  Chief Complaint  Patient presents with  . Weakness  . Hyperglycemia  .  History of Present Illness: Patient is admitted with weakness and multiple other ongoing issues.  He is bedbound and was found to have a large sacral decubitus which was recently debrided by general surgery. His dyspnea continued to worsen despite appropriate conservative therapies and pulmonary toilet, and ultimately he had a workup leading to a CT angiogram which demonstrated significant pulmonary embolus. No residual lower extremity DVT was seen on duplex, but the iliac veins were not imaged. A large enough and necrotic enough sacral decubitus that required surgical debridement. Since the initiation of anticoagulation, the sacral decubitus is had continued bleeding which is forced the cessation of anticoagulation. For this reason, the primary service has asked me to see the patient to place a filter. I have discussed the case with Dr. Leslye Peer who was the physician that requested the consult.  He had also discussed the case with pulmonology who recommended an IVC filter. The patient will be bed bound and his anticoagulation will have to be held for at least several days and potentially longer.  Current Facility-Administered Medications  Medication Dose Route Frequency Provider Last Rate Last Dose  . 0.9 %  sodium chloride infusion   Intravenous Continuous Algernon Huxley, MD 20 mL/hr at 03/11/16 1539    . [MAR Hold] acetaminophen (TYLENOL) tablet 650 mg  650 mg Oral Q6H PRN Lytle Butte, MD       Or  . Doug Sou Hold] acetaminophen (TYLENOL) suppository 650 mg  650 mg Rectal Q6H PRN Lytle Butte, MD      . Doug Sou Hold] atorvastatin (LIPITOR) tablet 10 mg  10 mg Oral QHS Lytle Butte, MD   10 mg at 03/10/16 2203  . [MAR Hold] ceFEPIme (MAXIPIME) 2 g in dextrose 5 % 50 mL  IVPB  2 g Intravenous Q12H Darylene Price [place], [facility]      . [MAR Hold] cholecalciferol (VITAMIN D) tablet 1,000 Units  1,000 Units Oral Daily Lytle Butte, MD   1,000 Units at 03/11/16 1055  . clindamycin (CLEOCIN) 300 MG/50ML IVPB           . [MAR Hold] diatrizoate meglumine-sodium (GASTROGRAFIN) 66-10 % solution 15 mL  15 mL Oral Once Joanne Gavel, MD      . Doug Sou Hold] digoxin (LANOXIN) tablet 0.125 mg  0.125 mg Oral Daily Lytle Butte, MD   0.125 mg at 03/11/16 1054  . [MAR Hold] feeding supplement (ENSURE ENLIVE) (ENSURE ENLIVE) liquid 237 mL  237 mL Oral TID PC Loletha Grayer, MD   237 mL at 03/11/16 0810  . [MAR Hold] insulin aspart (novoLOG) injection 0-20 Units  0-20 Units Subcutaneous TID WC Loletha Grayer, MD   7 Units at 03/11/16 1234  . [MAR Hold] insulin aspart (novoLOG) injection 0-5 Units  0-5 Units Subcutaneous QHS Lytle Butte, MD   5 Units at 03/10/16 2204  . [MAR Hold] insulin detemir (LEVEMIR) injection 15 Units  15 Units Subcutaneous QHS Loletha Grayer, MD      . Doug Sou Hold] magnesium oxide (MAG-OX) tablet 400 mg  400 mg Oral BID Lytle Butte, MD   400 mg at 03/11/16 1054  . [MAR Hold] metoprolol succinate (TOPROL-XL) 24 hr tablet 50 mg  50 mg Oral Daily  Lytle Butte, MD   50 mg at 03/11/16 1054  . [MAR Hold] morphine 2 MG/ML injection 2 mg  2 mg Intravenous Q4H PRN Lytle Butte, MD      . Doug Sou Hold] multivitamin (RENA-VIT) tablet 1 tablet  1 tablet Oral Daily Lytle Butte, MD   1 tablet at 03/11/16 1054  . [MAR Hold] multivitamin with minerals tablet 1 tablet  1 tablet Oral Daily Lytle Butte, MD   1 tablet at 03/11/16 1054  . [MAR Hold] ondansetron (ZOFRAN) tablet 4 mg  4 mg Oral Q6H PRN Lytle Butte, MD       Or  . Doug Sou Hold] ondansetron [facility]) injection 4 mg  4 mg Intravenous Q6H PRN Lytle Butte, MD      . Doug Sou Hold] oxyCODONE (Oxy IR/ROXICODONE) immediate release tablet 5 mg  5 mg Oral Q4H PRN Lytle Butte, MD      . Doug Sou Hold] sodium chloride 0.9 % bolus 1,000  mL  1,000 mL Intravenous Once Lytle Butte, MD      . Doug Sou Hold] sodium chloride flush (NS) 0.9 % injection 3 mL  3 mL Intravenous Q12H Lytle Butte, MD   3 mL at 03/11/16 1055  . [MAR Hold] tamsulosin (FLOMAX) capsule 0.4 mg  0.4 mg Oral QPC supper Loletha Grayer, MD   0.4 mg at 03/10/16 1721  . [MAR Hold] vancomycin (VANCOCIN) IVPB 1000 mg/200 mL premix  1,000 mg Intravenous Q12H Lytle Butte, MD   1,000 mg at 03/11/16 [id number]    Past Medical History  Diagnosis Date  . Diabetes mellitus without complication ([place])   . MI (myocardial infarction) ([place])   . A-fib ([place])   . Cancer [facility])     History reviewed. No pertinent past surgical history.  Social History Social History  Substance Use Topics  . Smoking status: Never Smoker   . Smokeless tobacco: None  . Alcohol Use: None  No IVDU  Family History No history of bleeding disorders, clotting disorders, or autoimmune diseases  Allergies  Allergen Reactions  . Penicillins Other (See Comments)    Reaction:  Unknown      REVIEW OF SYSTEMS (Negative unless checked)  Constitutional: [] Weight loss  [] Fever  [] Chills Cardiac: [x] Chest pain   [] Chest pressure   [x] Palpitations   [x] Shortness of breath when laying flat   [x] Shortness of breath at rest   [] Shortness of breath with exertion. Vascular:  [] Pain in legs with walking   [] Pain in legs at rest   [] Pain in legs when laying flat   [] Claudication   [] Pain in feet when walking  [] Pain in feet at rest  [] Pain in feet when laying flat   [] History of DVT   [] Phlebitis   [x] Swelling in legs   [] Varicose veins   [x] Non-healing ulcers Pulmonary:   [] Uses home oxygen   [] Productive cough   [] Hemoptysis   [] Wheeze  [] COPD   [] Asthma Neurologic:  [] Dizziness  [] Blackouts   [] Seizures   [] History of stroke   [] History of TIA  [] Aphasia   [] Temporary blindness   [] Dysphagia   [] Weakness or numbness in arms   [] Weakness or numbness in legs Musculoskeletal:  [] Arthritis   [] Joint swelling    [] Joint pain   [] Low back pain Hematologic:  [] Easy bruising  [] Easy bleeding   [] Hypercoagulable state   [] Anemic  [] Hepatitis Gastrointestinal:  [] Blood in stool   [] Vomiting blood  [] Gastroesophageal reflux/heartburn   [] Difficulty swallowing. Genitourinary:  []   Chronic kidney disease   [] Difficult urination  [] Frequent urination  [] Burning with urination   [] Blood in urine Skin:  [] Rashes   [x] Ulcers   [x] Wounds Psychological:  [] History of anxiety   []  History of major depression.  Physical Examination  Filed Vitals:   03/10/16 1139 03/10/16 2010 03/11/16 0538 03/11/16 1530  BP: 109/75 125/79 110/67 102/64  Pulse: 69 88 72 77  Temp: 98 F (36.7 C) 97.5 F (36.4 C) 98.2 F (36.8 C) 98.2 F (36.8 C)  TempSrc:  Oral Oral   Resp: 17 18 18 22   Height:      Weight:      SpO2: 100% 100% 94% 97%   Body mass index is 22.39 kg/(m^2). Gen:  Debilitated and frail appearing Head: [place]/AT, No temporalis wasting. Prominent temp pulse not noted. Ear/Nose/Throat: Hearing grossly intact, nares w/o erythema or drainage, oropharynx w/o Erythema/Exudate Eyes: PERRLA, EOMI.  Neck: Supple, no nuchal rigidity.  No JVD.  Pulmonary:  Good air movement, crackles in bases bilaterally.  Cardiac: irregularly irregular Vascular:  Vessel Right Left  Radial Palpable Palpable                                   Gastrointestinal: soft, non-tender/non-distended. No guarding/reflex. No masses, surgical incisions, or scars. Musculoskeletal: diffusely weak throughout.  Extremities without ischemic changes.  No deformity or atrophy. Mild to moderate lower extremity edema. Neurologic: CN 2-12 intact. Pain and light touch intact in extremities.  Symmetrical.  Speech is fluent. Motor exam as listed above. Psychiatric: Judgment intact, Mood & affect appropriate for pt's clinical situation. Dermatologic: large sacral decubitus with oozing present Lymph : No Cervical, Axillary, or Inguinal  lymphadenopathy.    CBC Lab Results  Component Value Date   WBC 11.1* 03/11/2016   HGB 10.3* 03/11/2016   HCT 30.5* 03/11/2016   MCV 92.4 03/11/2016   PLT 172 03/11/2016    BMET    Component Value Date/Time   NA 134* 03/09/2016 0439   K 3.6 03/09/2016 0439   CL 99* 03/09/2016 0439   CO2 27 03/09/2016 0439   GLUCOSE 257* 03/09/2016 0439   BUN 34* 03/09/2016 0439   CREATININE 0.55* 03/09/2016 0439   CALCIUM 9.4 03/09/2016 0439   GFRNONAA >60 03/09/2016 0439   GFRAA >60 03/09/2016 0439   Estimated Creatinine Clearance: 65.7 mL/min (by C-G formula based on Cr of 0.55).  COAG No results found for: INR, PROTIME  Radiology Ct Angio Chest Pe W/cm &/or Wo Cm  03/11/2016  CLINICAL DATA:  History of pulmonary embolus. EXAM: CT ANGIOGRAPHY CHEST WITH CONTRAST TECHNIQUE: Multidetector CT imaging of the chest was performed using the standard protocol during bolus administration of intravenous contrast. Multiplanar CT image reconstructions and MIPs were obtained to evaluate the vascular anatomy. CONTRAST:  100 mL of Isovue 370 intravenously. COMPARISON:  CT scan of Mar 08, 2016. FINDINGS: Severe multilevel degenerative disc disease is noted throughout the thoracic spine. No pneumothorax is noted. Mild bilateral pleural effusions are noted with adjacent subsegmental atelectasis. Mild inflammatory changes or atelectasis or scar are noted anteriorly in the lingular segment of the left upper lobe. There are again noted large acute emboli in the lower lobe branches of both pulmonary arteries as described on prior exam. No acute abnormality seen in the visualized portion of the upper abdomen. 4.3 cm ascending thoracic aortic aneurysm is noted. Coronary artery calcifications are noted. No significant mediastinal mass or adenopathy  is noted. RV/LV ratio of greater than 1.5 is noted suggesting right heart strain. Review of the MIP images confirms the above findings. IMPRESSION: Large bilateral pulmonary  emboli are again noted as described on prior exam. RV/LV ratio of greater than 1.5 is noted suggesting right heart strain. Mild bilateral pleural effusions are noted with adjacent subsegmental atelectasis. Focal opacity is noted anteriorly in the lingular segment of the left upper lobe concerning for atelectasis, scarring or possibly inflammation. Coronary artery calcifications are noted suggesting Coronary artery disease. 4.3 cm ascending thoracic aortic aneurysm is noted. Recommend annual imaging followup by CTA or MRA. This recommendation follows 2010 ACCF/AHA/AATS/ACR/ASA/SCA/SCAI/SIR/STS/SVM Guidelines for the Diagnosis and Management of Patients with Thoracic Aortic Disease. Circulation. 2010; [id number]. Electronically Signed   By: Marijo Conception, M.D.   On: 03/11/2016 12:27   Ct Abdomen Pelvis W Contrast  03/08/2016  ADDENDUM REPORT: 03/08/2016 19:21 ADDENDUM: Further imaging has now been obtained to evaluate the perineal and scrotal area. Mild scrotal thickening is noted although no abscess is seen. No other focal abnormality is seen. Electronically Signed   By: Inez Catalina M.D.   On: 03/08/2016 19:21  03/08/2016  CLINICAL DATA:  Incontinence stools and urine with left-sided swelling EXAM: CT ABDOMEN AND PELVIS WITH CONTRAST TECHNIQUE: Multidetector CT imaging of the abdomen and pelvis was performed using the standard protocol following bolus administration of intravenous contrast. CONTRAST:  160mL ISOVUE-300 IOPAMIDOL (ISOVUE-300) INJECTION 61% COMPARISON:  None. FINDINGS: Lung bases demonstrate no evidence focal infiltrate or sizable effusion. There are however changes consistent with bilateral pulmonary emboli. The right ventricle is somewhat enlarged consistent with a degree of right heart strain. Some minimal atelectatic changes in the right lower lobe are seen. The liver, gallbladder, spleen, adrenal glands and pancreas are within normal limits. Kidneys are well visualized bilaterally and  demonstrate bilateral renal cystic change. Prominence of the right renal collecting system is noted although no obstructing stone is seen. This is likely related to the over distension of the urinary bladder. Similar changes are noted on the left. Aortoiliac calcifications are identified. No pelvic mass lesion or sidewall abnormality is noted. Changes of prior prostate brachytherapy are seen. The appendix is not visualized although no inflammatory changes are seen. The bony structures show degenerative change of the lumbar spine. No acute compression deformity is seen. Soft tissue swelling is noted in the left lower extremity likely related to deep venous thrombosis given the pulmonary emboli. Further evaluation by means of ultrasound can be performed as clinically indicated. By history there is a scrotal/perineal wound which was incompletely evaluated on this scan. Attempts to retrieve the patient for further scanning have been unsuccessful to this point. IMPRESSION: Bilateral lower lobe pulmonary emboli. There are changes signifying right heart strain consistent with increased morbidity and mortality. Bilateral renal cystic change. Significantly distended urinary bladder. This may reflect a degree of bladder outlet obstruction. Compensatory dilatation of the collecting systems is noted bilaterally. Left lower extremity swelling likely related to deep venous thrombosis. By history there is a scrotal/perineal wound. Attempts to retrieve the patient for additional imaging were unsuccessful to this point due to Clinical needs on the floor. These results were called by telephone at the time of interpretation on 03/08/2016 at 6:06 pm to Dr. Valentino Nose , who verbally acknowledged these results. Electronically Signed: By: Inez Catalina M.D. On: 03/08/2016 18:14   US Venous Img Lower Bilateral  03/10/2016  CLINICAL DATA:  Pulmonary embolism. EXAM: BILATERAL LOWER EXTREMITY VENOUS DOPPLER  ULTRASOUND TECHNIQUE: Gray-scale  sonography with graded compression, as well as color Doppler and duplex ultrasound were performed to evaluate the lower extremity deep venous systems from the level of the common femoral vein and including the common femoral, femoral, profunda femoral, popliteal and calf veins including the posterior tibial, peroneal and gastrocnemius veins when visible. The superficial great saphenous vein was also interrogated. Spectral Doppler was utilized to evaluate flow at rest and with distal augmentation maneuvers in the common femoral, femoral and popliteal veins. COMPARISON:  CT 03/08/2016.  Ultrasound 06/21/2005. FINDINGS: RIGHT LOWER EXTREMITY Common Femoral Vein: No evidence of thrombus. Normal compressibility, respiratory phasicity and response to augmentation. Saphenofemoral Junction: No evidence of thrombus. Normal compressibility and flow on color Doppler imaging. Profunda Femoral Vein: No evidence of thrombus. Normal compressibility and flow on color Doppler imaging. Femoral Vein: No evidence of thrombus. Normal compressibility, respiratory phasicity and response to augmentation. Popliteal Vein: No evidence of thrombus. Normal compressibility, respiratory phasicity and response to augmentation. Calf Veins: No evidence of thrombus. Normal compressibility and flow on color Doppler imaging. Superficial Great Saphenous Vein: No evidence of thrombus. Normal compressibility and flow on color Doppler imaging. Other Findings:  None. LEFT LOWER EXTREMITY Common Femoral Vein: No evidence of thrombus. Normal compressibility, respiratory phasicity and response to augmentation. Saphenofemoral Junction: No evidence of thrombus. Normal compressibility and flow on color Doppler imaging. Profunda Femoral Vein: No evidence of thrombus. Normal compressibility and flow on color Doppler imaging. Femoral Vein: Nonocclusive thrombus noted. Popliteal Vein: No evidence of thrombus. Normal compressibility, respiratory phasicity and response  to augmentation. Calf Veins: No evidence of thrombus. Normal compressibility and flow on color Doppler imaging. Superficial Great Saphenous Vein: No evidence of thrombus. Normal compressibility and flow on color Doppler imaging. Other Findings:  None. IMPRESSION: Nonocclusive thrombus in the left femoral vein. Electronically Signed   By: Marcello Moores  Register   On: 03/10/2016 06:56   Dg Chest Portable 1 View  03/08/2016  CLINICAL DATA:  Lower extremity edema. EXAM: PORTABLE CHEST 1 VIEW COMPARISON:  01/12/2009 FINDINGS: The heart size and mediastinal contours are within normal limits. Both lungs are clear. The visualized skeletal structures are unremarkable. IMPRESSION: No active disease. Electronically Signed   By: Kathreen Devoid   On: 03/08/2016 14:54      Assessment/Plan 1. PE with bleeding on Eliquis.  Unable to anticoagulate in short term.  Would agree with IVC filter placement in an immobile, bedbound patient.  Can consider removal once on anticoagulation and tolerating well.  Risks and benefits discussed and he is agreeable to proceed 2. Decubitus ulcer. Significant bleeding while on Eliquis.  Continue local wound care. 3. Diabetes mellitus. Optimize glucose control good for wound healing as well as avoidance of atherosclerotic disease. 4. Heart disease/atrial fibrillation. Medical service managing.   Alyzabeth Pontillo, MD  03/11/2016 5:05 PM

## 2016-03-11 NOTE — Progress Notes (Signed)
82 yr old with multile medical issues, including sacral decubitus, debrided at bedside yesterday.  He did have some bleeding overnight from site.  Improved now. Patient alert and feeling better today.  Filed Vitals:   03/10/16 2010 03/11/16 0538  BP: 125/79 110/67  Pulse: 88 72  Temp: 97.5 F (36.4 C) 98.2 F (36.8 C)  Resp: 18 18    I/O last 3 completed shifts: In: 2535 [I.V.:1785; IV Piggyback:750] Out: [id number] [Urine:3925] Total I/O In: 653.3 [P.O.:360; I.V.:293.3] Out: 0    PE:  Gen; NAD Res: crackles in bases Cardio: RRR Abd: soft, nt, nd Sacrum:  9x5cm area of sacral decub with healthy tissue, no bleeding, stage III ulcer superior to sacrum 4x2cm in size   CBC Latest Ref Rng 03/11/2016 03/10/2016 03/09/2016  WBC 3.8 - 10.6 K/uL 11.1(H) - 12.6(H)  Hemoglobin 13.0 - 18.0 g/dL 10.3(L) 11.5(L) 13.0  Hematocrit 40.0 - 52.0 % 30.5(L) - 38.8(L)  Platelets 150 - 440 K/uL 172 - 177    CMP Latest Ref Rng 03/09/2016 03/08/2016  Glucose 65 - 99 mg/dL 257(H) 515(HH)  BUN 6 - 20 mg/dL 34(H) 36(H)  Creatinine 0.61 - 1.24 mg/dL 0.55(L) 0.64  Sodium 135 - 145 mmol/L 134(L) 128(L)  Potassium 3.5 - 5.1 mmol/L 3.6 4.0  Chloride 101 - 111 mmol/L 99(L) 99(L)  CO2 22 - 32 mmol/L 27 14(L)  Calcium 8.9 - 10.3 mg/dL 9.4 8.0(L)  Total Protein 6.5 - 8.1 g/dL - 5.6(L)  Total Bilirubin 0.3 - 1.2 mg/dL - 1.2  Alkaline Phos 38 - 126 U/L - 68  AST 15 - 41 U/L - 31  ALT 17 - 63 U/L - 18    A/P:  82 yr old with multile medical issues, including sacral decubitus, debrided at bedside yesterday, no further bleeding this afternoon, continue dakin's dressings.

## 2016-03-11 NOTE — Op Note (Signed)
[facility]   OPERATIVE NOTE    PRE-OPERATIVE DIAGNOSIS: Pulmonary embolus with bleeding on anticoagulation.  POST-OPERATIVE DIAGNOSIS: Same as above  PROCEDURE: 1.   Ultrasound guidance for vascular access to the right femoral vein 2.   Catheter placement into the inferior vena cava 3.   Inferior venacavogram 4.   Placement of a Cook Celect IVC filter  SURGEON: Leotis Pain, MD  ASSISTANT(S): None  ANESTHESIA: local with Sedation for approximately 10 minutes using 2 mg of Versed   ESTIMATED BLOOD LOSS: minimal  CONTRAST: 15 cc  FLUORO TIME: less than one minute  FINDING(S): 1.  Patent IVC  SPECIMEN(S):  none  INDICATIONS:   Paul Mcmahon is a 82 y.o. male who presents with multiple ongoing issues and bleeding after anticoagulation was initiated for pulmonary embolus.  Inferior vena cava filter is indicated for this reason.  Risks and benefits including filter thrombosis, migration, fracture, bleeding, and infection were all discussed.  We discussed that all IVC filters that we place can be removed if desired from the patient once the need for the filter has passed.    DESCRIPTION: After obtaining full informed written consent, the patient was brought back to the vascular suite. The skin was sterilely prepped and draped in a sterile surgical field was created. Moderate conscious sedation was administered during a face to face encounter with the patient throughout the procedure with my supervision of the RN administering medicines and monitoring the patient's vital signs, pulse oximetry, telemetry and mental status throughout from the start of the procedure until the patient was taken to the recovery room. The right femoral vein was accessed under direct ultrasound guidance without difficulty with a Seldinger needle and a J-wire was then placed. After skin nick and dilatation, the delivery sheath was placed into the inferior vena cava and an inferior  venacavogram was performed. This demonstrated a patent IVC with the level of the renal veins at L1.  The filter was then deployed into the inferior vena cava at the level of the L1-L2 interspace just below the renal veins. The delivery sheath was then removed. Pressure was held. Sterile dressings were placed. The patient tolerated the procedure well and was taken to the recovery room in stable condition.  COMPLICATIONS: None  CONDITION: Stable  Paul Mcmahon  03/11/2016, 5:23 PM

## 2016-03-11 NOTE — Progress Notes (Signed)
  Called by nursing staff to evaluate patient for continued bleeding at the debridement site from earlier today. Per nursing he has saturated through to the bed numerous times already this shift.  On examination patient has evidence of venous is from most right lateral aspect of the debrided ulcer.  Area cleaned and a dressing of dry gauze and ABDs pad was placed over this and a pressure dressing format.  Discussed with nursing staff continuing compression style dry dressing until dressing change is hemostatic. At which point they can then revert to the [place] soaked gauze for debridement of the ulcer. Surgery will continue to follow  Clayburn Pert, MD [place] [facility]

## 2016-03-11 NOTE — Progress Notes (Signed)
Patient ID: Paul Mcmahon, male   DOB: 1925-05-11, 82 y.o.   MRN: [id number]   Bleeding seems less this afternoon Hold blood thinner today and maybe able to restart tomorrow Spoke with Dr Lucky Cowboy vascular surgery to place a filter today  Dr Leslye Peer

## 2016-03-12 LAB — BASIC METABOLIC PANEL
Anion gap: 3 — ABNORMAL LOW (ref 5–15)
BUN: 23 mg/dL — ABNORMAL HIGH (ref 6–20)
CALCIUM: 8.1 mg/dL — AB (ref 8.9–10.3)
CO2: 27 mmol/L (ref 22–32)
CREATININE: 0.44 mg/dL — AB (ref 0.61–1.24)
Chloride: 99 mmol/L — ABNORMAL LOW (ref 101–111)
Glucose, Bld: 109 mg/dL — ABNORMAL HIGH (ref 65–99)
Potassium: 4 mmol/L (ref 3.5–5.1)
Sodium: 129 mmol/L — ABNORMAL LOW (ref 135–145)

## 2016-03-12 LAB — TYPE AND SCREEN
ABO/RH(D): A POS
ANTIBODY SCREEN: NEGATIVE

## 2016-03-12 LAB — CBC
HEMATOCRIT: 25.3 % — AB (ref 40.0–52.0)
Hemoglobin: 8.8 g/dL — ABNORMAL LOW (ref 13.0–18.0)
MCH: 31.4 pg (ref 26.0–34.0)
MCHC: 34.6 g/dL (ref 32.0–36.0)
MCV: 90.9 fL (ref 80.0–100.0)
PLATELETS: 156 10*3/uL (ref 150–440)
RBC: 2.78 MIL/uL — ABNORMAL LOW (ref 4.40–5.90)
RDW: 14.4 % (ref 11.5–14.5)
WBC: 9.4 10*3/uL (ref 3.8–10.6)

## 2016-03-12 LAB — ABO/RH: ABO/RH(D): A POS

## 2016-03-12 LAB — GLUCOSE, CAPILLARY
GLUCOSE-CAPILLARY: 388 mg/dL — AB (ref 65–99)
GLUCOSE-CAPILLARY: 384 mg/dL — AB (ref 65–99)
Glucose-Capillary: 83 mg/dL (ref 65–99)

## 2016-03-12 MED ORDER — APIXABAN 5 MG PO TABS: 5.0000 mg | ORAL_TABLET | Freq: Two times a day (BID) | ORAL | Status: DC

## 2016-03-12 MED ORDER — METOPROLOL SUCCINATE ER 25 MG PO TB24
25.0000 mg | ORAL_TABLET | Freq: Every day | ORAL | Status: DC
  Administered 2016-03-13 – 2016-03-15 (×3): 25 mg via ORAL
  Filled 2016-03-12 (×3): qty 1

## 2016-03-12 MED ORDER — LACTULOSE 10 GM/15ML PO SOLN
30.0000 g | Freq: Once | ORAL | Status: AC
  Administered 2016-03-12: 30 g via ORAL
  Filled 2016-03-12: qty 60

## 2016-03-12 MED ORDER — IRON SUCROSE 20 MG/ML IV SOLN
400.0000 mg | Freq: Once | INTRAVENOUS | Status: AC
  Administered 2016-03-12: 400 mg via INTRAVENOUS
  Filled 2016-03-12: qty 20

## 2016-03-12 MED ORDER — APIXABAN 5 MG PO TABS
10.0000 mg | ORAL_TABLET | Freq: Two times a day (BID) | ORAL | Status: DC
  Administered 2016-03-13 – 2016-03-15 (×5): 10 mg via ORAL
  Filled 2016-03-12 (×5): qty 2

## 2016-03-12 MED ORDER — INSULIN DETEMIR 100 UNIT/ML ~~LOC~~ SOLN
12.0000 [IU] | Freq: Every day | SUBCUTANEOUS | Status: DC
  Administered 2016-03-12 – 2016-03-14 (×3): 12 [IU] via SUBCUTANEOUS
  Filled 2016-03-12 (×4): qty 0.12

## 2016-03-12 NOTE — Progress Notes (Signed)
ANTICOAGULATION CONSULT NOTE - Initial Consult  Pharmacy Consult for apixaban Indication: Bilateral PE  Allergies  Allergen Reactions  . Penicillins Other (See Comments)    Reaction:  Unknown    Patient Measurements: Height: 5\' 11"  (180.3 cm) Weight: 160 lb 7 oz (72.774 kg) IBW/kg (Calculated) : 75.3  Vital Signs: Temp: 98.1 F (36.7 C) (05/20 1110) Temp Source: Oral (05/20 1110) BP: 90/53 mmHg (05/20 1110) Pulse Rate: 78 (05/20 1110)  Labs:  Recent Labs  03/11/16 1255 03/12/16 0439  HGB 10.3* 8.8*  HCT 30.5* 25.3*  PLT 172 156  CREATININE  --  0.44*   Estimated Creatinine Clearance: 65.7 mL/min (by C-G formula based on Cr of 0.44).  Medical History: Past Medical History  Diagnosis Date  . Diabetes mellitus without complication ([place])   . MI (myocardial infarction) ([place])   . A-fib ([place])   . Cancer [facility])    Assessment: Pharmacy consulted to dose apixaban in this 82 year old male diagnosed with bilateral PE. Patient was prescribed edoxaban prior to admission and was transitioned to therapeutic enoxaparin for PE treatment.  Patient was started on Eliquis on 5/17, but it was held due to anemia and bleeding from sacral decubiti. MD would like to restart therapy tomorrow AM 5/21 @1000 .  Goal of Therapy:  Monitor platelets by anticoagulation protocol: Yes   Plan:  Initiate apixaban 10 mg PO BID x 7 days followed by apixaban 5 mg PO BID. Order to start course of treatment tomorrow AM.  Pharmacy to monitor CBC per protocol.  Thank you for allowing pharmacy to be involved in this patients care.  Roe Coombs, PharmD Pharmacy Resident 03/12/2016

## 2016-03-12 NOTE — Progress Notes (Signed)
Patient ID: Paul Mcmahon, male   DOB: 04/03/1925, 82 y.o.   MRN: [id number] [facility] PROGRESS NOTE  Paul Mcmahon [id number] DOB: 01-28-1925 DOA: 03/08/2016 PCP: Juluis Pitch, MD  HPI/Subjective: Patient physically feeling okay. Slight shortness of breath. No chest pain. He hasn't noticed any further bleeding.  Objective: Filed Vitals:   03/12/16 1000 03/12/16 1110  BP: 100/53 90/53  Pulse: 87 78  Temp:  98.1 F (36.7 C)  Resp:  17    Filed Weights   03/08/16 1355 03/09/16 1646  Weight: 77.111 kg (170 lb) 72.774 kg (160 lb 7 oz)    ROS: Review of Systems  Constitutional: Negative for fever and chills.  Eyes: Negative for blurred vision.  Respiratory: Positive for shortness of breath. Negative for cough.   Cardiovascular: Negative for chest pain.  Gastrointestinal: Positive for constipation. Negative for nausea, vomiting, abdominal pain and diarrhea.  Genitourinary: Negative for dysuria.  Musculoskeletal: Negative for joint pain.  Neurological: Negative for dizziness and headaches.   Exam: Physical Exam  HENT:  Nose: No mucosal edema.  Mouth/Throat: No oropharyngeal exudate or posterior oropharyngeal edema.  Eyes: Conjunctivae, EOM and lids are normal. Pupils are equal, round, and reactive to light.  Neck: No JVD present. Carotid bruit is not present. No edema present. No thyroid mass and no thyromegaly present.  Cardiovascular: S1 normal and S2 normal.  Exam reveals no gallop.   No murmur heard. Pulses:      Dorsalis pedis pulses are 2+ on the right side, and 2+ on the left side.  Respiratory: No respiratory distress. He has no wheezes. He has no rhonchi. He has no rales.  GI: Soft. Bowel sounds are normal. There is no tenderness.  Musculoskeletal:       Right ankle: He exhibits swelling.       Left ankle: He exhibits swelling.  Lymphadenopathy:    He has no cervical adenopathy.  Neurological: He is alert.  Skin: Skin is warm. Nails show no clubbing.   Psychiatric: He has a normal mood and affect.      Data Reviewed: Basic Metabolic Panel:  Recent Labs Lab 03/08/16 1357 03/09/16 0439 03/12/16 0439  NA 128* 134* 129*  K 4.0 3.6 4.0  CL 99* 99* 99*  CO2 14* 27 27  GLUCOSE 515* 257* 109*  BUN 36* 34* 23*  CREATININE 0.64 0.55* 0.44*  CALCIUM 8.0* 9.4 8.1*   Liver Function Tests:  Recent Labs Lab 03/08/16 1357  AST 31  ALT 18  ALKPHOS 68  BILITOT 1.2  PROT 5.6*  ALBUMIN 2.4*   CBC:  Recent Labs Lab 03/08/16 1357 03/09/16 0439 03/10/16 2326 03/11/16 1255 03/12/16 0439  WBC 12.7* 12.6*  --  11.1* 9.4  HGB 12.7* 13.0 11.5* 10.3* 8.8*  HCT 40.4 38.8*  --  30.5* 25.3*  MCV 96.5 92.4  --  92.4 90.9  PLT 171 177  --  172 156   Cardiac Enzymes:  Recent Labs Lab 03/08/16 1357 03/08/16 1726 03/08/16 2326 03/09/16 0439  TROPONINI 0.06* 0.16* 0.08* 0.14*   BNP (last 3 results)  Recent Labs  03/08/16 1357  BNP 258.0*    CBG:  Recent Labs Lab 03/11/16 0726 03/11/16 1228 03/11/16 1759 03/11/16 2113 03/12/16 0725  GLUCAP 81 231* 331* 245* 83    Recent Results (from the past 240 hour(s))  Culture, blood (Routine X 2) w Reflex to ID Panel     Status: None (Preliminary result)   Collection Time: 03/08/16  2:40  PM  Result Value Ref Range Status   Specimen Description BLOOD LEFT WRIST  Final   Special Requests   Final    BOTTLES DRAWN AEROBIC AND ANAEROBIC  AERO 10CC ANA 2CC   Culture NO GROWTH 3 DAYS  Final   Report Status PENDING  Incomplete  Culture, blood (Routine X 2) w Reflex to ID Panel     Status: None (Preliminary result)   Collection Time: 03/08/16  2:43 PM  Result Value Ref Range Status   Specimen Description BLOOD LEFT ASSIST CONTROL  Final   Special Requests   Final    BOTTLES DRAWN AEROBIC AND ANAEROBIC  AERO 10CC ANA 8CC   Culture NO GROWTH 3 DAYS  Final   Report Status PENDING  Incomplete  Anaerobic culture     Status: None (Preliminary result)   Collection Time: 03/08/16   3:58 PM  Result Value Ref Range Status   Specimen Description BUTTOCKS  Final   Special Requests NONE  Final   Culture HOLDING FOR POSSIBLE ANAEROBE  Final   Report Status PENDING  Incomplete  Wound culture     Status: None (Preliminary result)   Collection Time: 03/08/16  3:58 PM  Result Value Ref Range Status   Specimen Description BUTTOCKS  Final   Special Requests NONE  Final   Gram Stain   Final    FEW WBC SEEN MANY GRAM POSITIVE COCCI MANY GRAM NEGATIVE RODS FEW GRAM POSITIVE RODS    Culture   Final    Multiple bacterial morphotypes present, none predominant. Suggest appropriate recollection if clinically indicated.   Report Status PENDING  Incomplete     Studies: Ct Angio Chest Pe W/cm &/or Wo Cm  03/11/2016  CLINICAL DATA:  History of pulmonary embolus. EXAM: CT ANGIOGRAPHY CHEST WITH CONTRAST TECHNIQUE: Multidetector CT imaging of the chest was performed using the standard protocol during bolus administration of intravenous contrast. Multiplanar CT image reconstructions and MIPs were obtained to evaluate the vascular anatomy. CONTRAST:  100 mL of Isovue 370 intravenously. COMPARISON:  CT scan of Mar 08, 2016. FINDINGS: Severe multilevel degenerative disc disease is noted throughout the thoracic spine. No pneumothorax is noted. Mild bilateral pleural effusions are noted with adjacent subsegmental atelectasis. Mild inflammatory changes or atelectasis or scar are noted anteriorly in the lingular segment of the left upper lobe. There are again noted large acute emboli in the lower lobe branches of both pulmonary arteries as described on prior exam. No acute abnormality seen in the visualized portion of the upper abdomen. 4.3 cm ascending thoracic aortic aneurysm is noted. Coronary artery calcifications are noted. No significant mediastinal mass or adenopathy is noted. RV/LV ratio of greater than 1.5 is noted suggesting right heart strain. Review of the MIP images confirms the above  findings. IMPRESSION: Large bilateral pulmonary emboli are again noted as described on prior exam. RV/LV ratio of greater than 1.5 is noted suggesting right heart strain. Mild bilateral pleural effusions are noted with adjacent subsegmental atelectasis. Focal opacity is noted anteriorly in the lingular segment of the left upper lobe concerning for atelectasis, scarring or possibly inflammation. Coronary artery calcifications are noted suggesting Coronary artery disease. 4.3 cm ascending thoracic aortic aneurysm is noted. Recommend annual imaging followup by CTA or MRA. This recommendation follows 2010 ACCF/AHA/AATS/ACR/ASA/SCA/SCAI/SIR/STS/SVM Guidelines for the Diagnosis and Management of Patients with Thoracic Aortic Disease. Circulation. 2010; [id number]:5694362. Electronically Signed   By: Marijo Conception, M.D.   On: 03/11/2016 12:27    Scheduled Meds: .  atorvastatin  10 mg Oral QHS  . ceFEPime (MAXIPIME) IV  2 g Intravenous Q12H  . cholecalciferol  1,000 Units Oral Daily  . diatrizoate meglumine-sodium  15 mL Oral Once  . digoxin  0.125 mg Oral Daily  . feeding supplement (ENSURE ENLIVE)  237 mL Oral TID PC  . insulin aspart  0-20 Units Subcutaneous TID WC  . insulin aspart  0-5 Units Subcutaneous QHS  . insulin detemir  12 Units Subcutaneous QHS  . iron sucrose  400 mg Intravenous Once  . magnesium oxide  400 mg Oral BID  . metoprolol succinate  50 mg Oral Daily  . multivitamin  1 tablet Oral Daily  . multivitamin with minerals  1 tablet Oral Daily  . sodium chloride  1,000 mL Intravenous Once  . sodium chloride flush  3 mL Intravenous Q12H  . tamsulosin  0.4 mg Oral QPC supper  . vancomycin  1,000 mg Intravenous Q12H    Assessment/Plan:  1. Clinical sepsis. Source could be sacral decubiti versus acute cystitis with hematuria. Patient placed on aggressive antibiotics with vancomycin and cefepime.  Unfortunately no urine culture sent on presentation. Wound culture growing 5 organisms and  final results will not be back Today at some point. 2. Acute hemorrhagic anemia. Hemoglobin dropped down to 8.8. Patient's blood pressure on the lower side. I will give 1 dose of IV venofer today. Recheck hemoglobin tomorrow. Nurse will let me know if there is any further bleeding from the decubitus ulcer. I would like to try and restart eliquis either later today or tomorrow. 3. Sacral decubiti. status post bedside debridement. Continue antibiotics  4. Severe bladder distention Foley catheter placed. Flomax. 5. Bilateral lower pulmonary embolism with heart strain seen on CT scan. IVC filter placed yesterday. I would like to try to restart eliquis either later on tonight or tomorrow. We'll have to monitor closely for bleeding from decubitus ulcer site. 6. Atrial fibrillation with rapid ventricular response. Patient on digoxin, metoprolol.  6.   Elevated troponin. Likely demand ischemia from rapid       ventricular response 7.   relative hypotension. Cut back on metoprolol dose. 8.   Hyperlipidemia unspecified on atorvastatin 9.   Type 2 diabetes with better sugar this morning. I will cut back the Levemir insulin to 12 units and high-dose sliding scale. 10. Weakness. Will need placement  Code Status:     Code Status Orders        Start     Ordered   03/08/16 1556  Full code   Continuous     03/08/16 1557    Code Status History    Date Active Date Inactive Code Status Order ID Comments User Context   This patient has a current code status but no historical code status.    Advance Directive Documentation        Most Recent Value   Type of Advance Directive  Healthcare Power of Attorney, Living will   Pre-existing out of facility DNR order (yellow form or pink MOST form)     "MOST" Form in Place?       Family Communication: Spoke with son on the phone  Disposition Plan: Will need skilled nursing facility this week coming up  Antibiotics:  Vancomycin  Cefepime   Time spent: 25  minutes  [place], [place]

## 2016-03-13 LAB — ANAEROBIC CULTURE

## 2016-03-13 LAB — WOUND CULTURE

## 2016-03-13 LAB — BASIC METABOLIC PANEL
ANION GAP: 6 (ref 5–15)
BUN: 26 mg/dL — AB (ref 6–20)
CALCIUM: 8.5 mg/dL — AB (ref 8.9–10.3)
CO2: 26 mmol/L (ref 22–32)
CREATININE: 0.51 mg/dL — AB (ref 0.61–1.24)
Chloride: 98 mmol/L — ABNORMAL LOW (ref 101–111)
GFR calc Af Amer: >60 mL/min (ref >60)
GLUCOSE: 198 mg/dL — AB (ref 65–99)
Potassium: 3.7 mmol/L (ref 3.5–5.1)
Sodium: 130 mmol/L — ABNORMAL LOW (ref 135–145)

## 2016-03-13 LAB — GLUCOSE, CAPILLARY
Glucose-Capillary: 150 mg/dL — ABNORMAL HIGH (ref 65–99)
Glucose-Capillary: 282 mg/dL — ABNORMAL HIGH (ref 65–99)
Glucose-Capillary: 441 mg/dL — ABNORMAL HIGH (ref 65–99)
Glucose-Capillary: 256 mg/dL — ABNORMAL HIGH (ref 65–99)

## 2016-03-13 LAB — CULTURE, BLOOD (ROUTINE X 2)
Culture: NO GROWTH
Culture: NO GROWTH

## 2016-03-13 LAB — HEMOGLOBIN: HEMOGLOBIN: 8.9 g/dL — AB (ref 13.0–18.0)

## 2016-03-13 LAB — GLUCOSE, RANDOM: GLUCOSE: 419 mg/dL — AB (ref 65–99)

## 2016-03-13 MED ORDER — MEROPENEM 1 G IV SOLR
1.0000 g | Freq: Three times a day (TID) | INTRAVENOUS | Status: DC
  Administered 2016-03-13 – 2016-03-15 (×6): 1 g via INTRAVENOUS
  Filled 2016-03-13 (×7): qty 1

## 2016-03-13 NOTE — Progress Notes (Signed)
Pharmacy Antibiotic Note  Paul Mcmahon is a 82 y.o. male admitted on 03/08/2016 with wound infection with decubitus ulcer.  Pharmacy has been consulted for Vancomycin and Cefepime dosing. Wound culture growing Bacteroides, Prevotella, Pseudomonas, S aureus, Enterococcus, and Morganella.   Plan: After discussion with Dr. Posey Pronto, will change antibiotics to meropenem based on culture results.  Will order meropenem 1 g iv q 8 hours.   Height: 5\' 11"  (180.3 cm) Weight: 160 lb 7 oz (72.774 kg) IBW/kg (Calculated) : 75.3  Temp (24hrs), Avg:98 F (36.7 C), Min:97.8 F (36.6 C), Max:98.2 F (36.8 C)   Recent Labs Lab 03/08/16 1357 03/08/16 1726 03/08/16 2030 03/09/16 0439 03/10/16 1928 03/11/16 1255 03/12/16 0439 03/13/16 0438  WBC 12.7*  --   --  12.6*  --  11.1* 9.4  --   CREATININE 0.64  --   --  0.55*  --   --  0.44* 0.51*  LATICACIDVEN  --  2.9* 4.5*  --   --   --   --   --   VANCOTROUGH  --   --   --   --  16  --   --   --     Estimated Creatinine Clearance: 65.7 mL/min (by C-G formula based on Cr of 0.51).    Allergies  Allergen Reactions  . Penicillins Other (See Comments)    Reaction: Welts and blisters all over body   Antimicrobials this admission: Vancomycin  5/16 >> 5/21 Cefepime 5/16  >> 5/21 Meropenem 5/21 >>  Dose adjustments this admission: 5/18 VT = 16 mcg/mL. Continue same dose of vancomycin 1000 mg IV q12h  Microbiology results:  BCx: NG  UCx: Not obtained  Wound: Bacteroides ovatus, Prevotella bivia, Enterococcus faecalis, S aureus,  Pseudomonas, and Morganella  Thank you for allowing pharmacy to be a part of this patient's care.  Ulice Dash D, PharmD Clinical Pharmacist 03/13/2016 1:44 PM

## 2016-03-13 NOTE — Progress Notes (Signed)
Patient ID: Paul Mcmahon, male   DOB: 01-Feb-1925, 82 y.o.   MRN: [id number] [facility] PROGRESS NOTE  Paul Mcmahon [id number] DOB: [birth year] DOA: 03/08/2016 PCP: Paul Pitch, MD  HPI/Subjective: Patient without any evidence of further bleeding. Denies any complaints. Had had a bowel movement yesterday   Objective: Filed Vitals:   03/12/16 2042 03/13/16 0522  BP: 114/58 117/62  Pulse: 92 87  Temp: 97.8 F (36.6 C) 98.2 F (36.8 C)  Resp: 18 20    Filed Weights   03/08/16 1355 03/09/16 1646  Weight: 77.111 kg (170 lb) 72.774 kg (160 lb 7 oz)    ROS: Review of Systems  Constitutional: Negative for fever and chills.  Eyes: Negative for blurred vision.  Respiratory: Has some shortness of breath. Negative for cough.   Cardiovascular: Negative for chest pain.  Gastrointestinal: Improved constipation. Negative for nausea, vomiting, abdominal pain and diarrhea.  Genitourinary: Negative for dysuria.  Musculoskeletal: Negative for joint pain.  Neurological: Negative for dizziness and headaches.   Exam: Physical Exam  HENT:  Nose: No mucosal edema.  Mouth/Throat: No oropharyngeal exudate or posterior oropharyngeal edema.  Eyes: Conjunctivae, EOM and lids are normal. Pupils are equal, round, and reactive to light.  Neck: No JVD present. Carotid bruit is not present. No edema present. No thyroid mass and no thyromegaly present.  Cardiovascular: S1 normal and S2 normal.  Exam reveals no gallop.   No murmur heard. Pulses:      Dorsalis pedis pulses are 2+ on the right side, and 2+ on the left side.  Respiratory: No respiratory distress. He has no wheezes. He has no rhonchi. He has no rales.  GI: Soft. Bowel sounds are normal. There is no tenderness.  Musculoskeletal:       Right ankle: He exhibits swelling.       Left ankle: He exhibits swelling.  Lymphadenopathy:    He has no cervical adenopathy.  Neurological: He is alert.  Skin: Skin is warm. Nails show no  clubbing.  Psychiatric: He has a normal mood and affect.      Data Reviewed: Basic Metabolic Panel:  Recent Labs Lab 03/08/16 1357 03/09/16 0439 03/12/16 0439 03/13/16 0438  NA 128* 134* 129* 130*  K 4.0 3.6 4.0 3.7  CL 99* 99* 99* 98*  CO2 14* 27 27 26   GLUCOSE 515* 257* 109* 198*  BUN 36* 34* 23* 26*  CREATININE 0.64 0.55* 0.44* 0.51*  CALCIUM 8.0* 9.4 8.1* 8.5*   Liver Function Tests:  Recent Labs Lab 03/08/16 1357  AST 31  ALT 18  ALKPHOS 68  BILITOT 1.2  PROT 5.6*  ALBUMIN 2.4*   CBC:  Recent Labs Lab 03/08/16 1357 03/09/16 0439 03/10/16 2326 03/11/16 1255 03/12/16 0439 03/13/16 0438  WBC 12.7* 12.6*  --  11.1* 9.4  --   HGB 12.7* 13.0 11.5* 10.3* 8.8* 8.9*  HCT 40.4 38.8*  --  30.5* 25.3*  --   MCV 96.5 92.4  --  92.4 90.9  --   PLT 171 177  --  172 156  --    Cardiac Enzymes:  Recent Labs Lab 03/08/16 1357 03/08/16 1726 03/08/16 2326 03/09/16 0439  TROPONINI 0.06* 0.16* 0.08* 0.14*   BNP (last 3 results)  Recent Labs  03/08/16 1357  BNP 258.0*    CBG:  Recent Labs Lab 03/11/16 2113 03/12/16 0725 03/12/16 1622 03/12/16 2135 03/13/16 0737  GLUCAP 245* 83 388* 384* 150*    Recent Results (from the past 240  hour(s))  Culture, blood (Routine X 2) w Reflex to ID Panel     Status: None (Preliminary result)   Collection Time: 03/08/16  2:40 PM  Result Value Ref Range Status   Specimen Description BLOOD LEFT WRIST  Final   Special Requests   Final    BOTTLES DRAWN AEROBIC AND ANAEROBIC  AERO 10CC ANA 2CC   Culture NO GROWTH 4 DAYS  Final   Report Status PENDING  Incomplete  Culture, blood (Routine X 2) w Reflex to ID Panel     Status: None (Preliminary result)   Collection Time: 03/08/16  2:43 PM  Result Value Ref Range Status   Specimen Description BLOOD LEFT ASSIST CONTROL  Final   Special Requests   Final    BOTTLES DRAWN AEROBIC AND ANAEROBIC  AERO 10CC ANA 8CC   Culture NO GROWTH 4 DAYS  Final   Report Status PENDING   Incomplete  Anaerobic culture     Status: None (Preliminary result)   Collection Time: 03/08/16  3:58 PM  Result Value Ref Range Status   Specimen Description BUTTOCKS  Final   Special Requests NONE  Final   Culture HOLDING FOR POSSIBLE ANAEROBE  Final   Report Status PENDING  Incomplete  Wound culture     Status: None (Preliminary result)   Collection Time: 03/08/16  3:58 PM  Result Value Ref Range Status   Specimen Description BUTTOCKS  Final   Special Requests NONE  Final   Gram Stain   Final    FEW WBC SEEN MANY GRAM POSITIVE COCCI MANY GRAM NEGATIVE RODS FEW GRAM POSITIVE RODS    Culture   Final    Multiple bacterial morphotypes present, none predominant. Suggest appropriate recollection if clinically indicated.   Report Status PENDING  Incomplete     Studies: Ct Angio Chest Pe W/cm &/or Wo Cm  03/11/2016  CLINICAL DATA:  History of pulmonary embolus. EXAM: CT ANGIOGRAPHY CHEST WITH CONTRAST TECHNIQUE: Multidetector CT imaging of the chest was performed using the standard protocol during bolus administration of intravenous contrast. Multiplanar CT image reconstructions and MIPs were obtained to evaluate the vascular anatomy. CONTRAST:  100 mL of Isovue 370 intravenously. COMPARISON:  CT scan of Mar 08, 2016. FINDINGS: Severe multilevel degenerative disc disease is noted throughout the thoracic spine. No pneumothorax is noted. Mild bilateral pleural effusions are noted with adjacent subsegmental atelectasis. Mild inflammatory changes or atelectasis or scar are noted anteriorly in the lingular segment of the left upper lobe. There are again noted large acute emboli in the lower lobe branches of both pulmonary arteries as described on prior exam. No acute abnormality seen in the visualized portion of the upper abdomen. 4.3 cm ascending thoracic aortic aneurysm is noted. Coronary artery calcifications are noted. No significant mediastinal mass or adenopathy is noted. RV/LV ratio of  greater than 1.5 is noted suggesting right heart strain. Review of the MIP images confirms the above findings. IMPRESSION: Large bilateral pulmonary emboli are again noted as described on prior exam. RV/LV ratio of greater than 1.5 is noted suggesting right heart strain. Mild bilateral pleural effusions are noted with adjacent subsegmental atelectasis. Focal opacity is noted anteriorly in the lingular segment of the left upper lobe concerning for atelectasis, scarring or possibly inflammation. Coronary artery calcifications are noted suggesting Coronary artery disease. 4.3 cm ascending thoracic aortic aneurysm is noted. Recommend annual imaging followup by CTA or MRA. This recommendation follows 2010 ACCF/AHA/AATS/ACR/ASA/SCA/SCAI/SIR/STS/SVM Guidelines for the Diagnosis and Management of Patients with  Thoracic Aortic Disease. Circulation. 2010; 121SP:1689793. Electronically Signed   By: Marijo Conception, M.D.   On: 03/11/2016 12:27    Scheduled Meds: . apixaban  10 mg Oral BID   Followed by  . [START ON 03/20/2016] apixaban  5 mg Oral BID  . atorvastatin  10 mg Oral QHS  . ceFEPime (MAXIPIME) IV  2 g Intravenous Q12H  . cholecalciferol  1,000 Units Oral Daily  . diatrizoate meglumine-sodium  15 mL Oral Once  . digoxin  0.125 mg Oral Daily  . feeding supplement (ENSURE ENLIVE)  237 mL Oral TID PC  . insulin aspart  0-20 Units Subcutaneous TID WC  . insulin aspart  0-5 Units Subcutaneous QHS  . insulin detemir  12 Units Subcutaneous QHS  . magnesium oxide  400 mg Oral BID  . metoprolol succinate  25 mg Oral Daily  . multivitamin  1 tablet Oral Daily  . multivitamin with minerals  1 tablet Oral Daily  . sodium chloride  1,000 mL Intravenous Once  . sodium chloride flush  3 mL Intravenous Q12H  . tamsulosin  0.4 mg Oral QPC supper  . vancomycin  1,000 mg Intravenous Q12H    Assessment/Plan:  1. Clinical sepsis. Source Due to sacral decubiti Wound cultures with multi-organism growth. Continue  cefepime and vancomycin for time being. Await identification of bacterial organisms 2. Acute hemorrhagic anemia. Status post dose of iron. Hemoglobin currently stable. No evidence of hemorrhage Patient restarted on anticoagulation 3. Sacral decubiti. status post bedside debridement. Continue antibiotics  4. Severe bladder distention continue Foley as well as Flomax. 5. Bilateral lower pulmonary embolism with heart strain seen on CT scan. IVC filter placed patient back on eiliquis, monitor hgb 6. Atrial fibrillation with rapid ventricular response. Patient on digoxin, metoprolol. Heart rate stable 6.   Elevated troponin. Likely demand ischemia from rapid       ventricular response 7.   relative hypotension. Currently stable with changes in metoprolol dose 8.   Hyperlipidemia unspecified continue atorvastatin 9.   Type 2 diabetes blood glucose labile continue current dose of Levemir and insulin 10. Weakness. Will need placement  Code Status:     Code Status Orders        Start     Ordered   03/08/16 1556  Full code   Continuous     03/08/16 1557    Code Status History    Date Active Date Inactive Code Status Order ID Comments User Context   This patient has a current code status but no historical code status.    Advance Directive Documentation        Most Recent Value   Type of Advance Directive  Healthcare Power of Attorney, Living will   Pre-existing out of facility DNR order (yellow form or pink MOST form)     "MOST" Form in Place?       Family Communication: Spoke with son on the phone  Disposition Plan: Will need skilled nursing facility this week coming up  Antibiotics:  Vancomycin  Cefepime   Time spent: 25 minutes  [place], [facility]

## 2016-03-13 NOTE — Progress Notes (Signed)
Patient bedside finger stick is 441. Not within standing orders for sliding scale. Dr. Posey Pronto notified stated to go ahead and give 20 units and let patient eat dinner and recheck per protocol. Will continue to monitor.

## 2016-03-13 NOTE — Progress Notes (Signed)
82 yr old with multile medical issues, including sacral decubitus, debrided at bedside yesterday.  He did have some bleeding overnight from site.  Improved now. Patient alert and feeling better today.  Filed Vitals:   03/13/16 0907 03/13/16 1146  BP: 111/61 106/59  Pulse: 87 73  Temp:    Resp:  20    I/O last 3 completed shifts: In: 240 [P.O.:240] Out: 1750 [Urine:1750]     PE:  Gen; NAD Res: crackles in bases Cardio: RRR Abd: soft, nt, nd Sacrum:  9x5cm area of sacral decub with healthy tissue, no bleeding, stage III ulcer superior to sacrum 4x2cm in size   CBC Latest Ref Rng 03/13/2016 03/12/2016 03/11/2016  WBC 3.8 - 10.6 K/uL - 9.4 11.1(H)  Hemoglobin 13.0 - 18.0 g/dL 8.9(L) 8.8(L) 10.3(L)  Hematocrit 40.0 - 52.0 % - 25.3(L) 30.5(L)  Platelets 150 - 440 K/uL - 156 172    CMP Latest Ref Rng 03/13/2016 03/12/2016 03/09/2016  Glucose 65 - 99 mg/dL 198(H) 109(H) 257(H)  BUN 6 - 20 mg/dL 26(H) 23(H) 34(H)  Creatinine 0.61 - 1.24 mg/dL 0.51(L) 0.44(L) 0.55(L)  Sodium 135 - 145 mmol/L 130(L) 129(L) 134(L)  Potassium 3.5 - 5.1 mmol/L 3.7 4.0 3.6  Chloride 101 - 111 mmol/L 98(L) 99(L) 99(L)  CO2 22 - 32 mmol/L 26 27 27   Calcium 8.9 - 10.3 mg/dL 8.5(L) 8.1(L) 9.4  Total Protein 6.5 - 8.1 g/dL - - -  Total Bilirubin 0.3 - 1.2 mg/dL - - -  Alkaline Phos 38 - 126 U/L - - -  AST 15 - 41 U/L - - -  ALT 17 - 63 U/L - - -    A/P:  82 yr old with multile medical issues, including sacral decubitus, debrided at bedside ; no further bleeding, continue Dakin's dressings, will need f/u at wound care center.  Surgery will sign off, please call us with any questions or concerns

## 2016-03-14 ENCOUNTER — Encounter: Payer: Self-pay | Admitting: Vascular Surgery

## 2016-03-14 LAB — GLUCOSE, CAPILLARY
GLUCOSE-CAPILLARY: 89 mg/dL (ref 65–99)
GLUCOSE-CAPILLARY: 185 mg/dL — AB (ref 65–99)
Glucose-Capillary: 322 mg/dL — ABNORMAL HIGH (ref 65–99)
Glucose-Capillary: 300 mg/dL — ABNORMAL HIGH (ref 65–99)

## 2016-03-14 MED ORDER — INSULIN ASPART 100 UNIT/ML ~~LOC~~ SOLN
3.0000 [IU] | Freq: Three times a day (TID) | SUBCUTANEOUS | Status: DC
  Administered 2016-03-14 – 2016-03-15 (×2): 3 [IU] via SUBCUTANEOUS
  Filled 2016-03-14 (×2): qty 3

## 2016-03-14 MED ORDER — INSULIN ASPART 100 UNIT/ML ~~LOC~~ SOLN
0.0000 [IU] | Freq: Three times a day (TID) | SUBCUTANEOUS | Status: DC
  Administered 2016-03-14: 11 [IU] via SUBCUTANEOUS
  Administered 2016-03-15: 5 [IU] via SUBCUTANEOUS
  Filled 2016-03-14: qty 11
  Filled 2016-03-14: qty 5

## 2016-03-14 NOTE — Progress Notes (Signed)
Patient ID: Paul Mcmahon, male   DOB: Aug 29, 1925, 82 y.o.   MRN: [id number] [facility] PROGRESS NOTE  Paul Mcmahon [id number] DOB: 09-23-1925 DOA: 03/08/2016 PCP: Paul Pitch, MD  HPI/Subjective: Patient complains of pain in his sacral region.  Objective: Filed Vitals:   03/13/16 1948 03/14/16 0436  BP: 120/69 103/52  Pulse: 85 76  Temp: 98.8 F (37.1 C) 98.3 F (36.8 C)  Resp: 18 20    Filed Weights   03/08/16 1355 03/09/16 1646  Weight: 77.111 kg (170 lb) 72.774 kg (160 lb 7 oz)    ROS: Review of Systems  Constitutional: Negative for fever and chills.  Eyes: Negative for blurred vision.  Respiratory: Shortness of breath resolved. Negative for cough.   Cardiovascular: Negative for chest pain.  Gastrointestinal: Improved constipation. Negative for nausea, vomiting, abdominal pain and diarrhea.  Genitourinary: Negative for dysuria.  Musculoskeletal: Negative for joint pain.  Neurological: Negative for dizziness and headaches.   Exam: Physical Exam  HENT:  Nose: No mucosal edema.  Mouth/Throat: No oropharyngeal exudate or posterior oropharyngeal edema.  Eyes: Conjunctivae, EOM and lids are normal. Pupils are equal, round, and reactive to light.  Neck: No JVD present. Carotid bruit is not present. No edema present. No thyroid mass and no thyromegaly present.  Cardiovascular: S1 normal and S2 normal.  Exam reveals no gallop.   No murmur heard. Pulses:      Dorsalis pedis pulses are 2+ on the right side, and 2+ on the left side.  Respiratory: No respiratory distress. He has no wheezes. He has no rhonchi. He has no rales.  GI: Soft. Bowel sounds are normal. There is no tenderness.  Musculoskeletal:       Right ankle: He exhibits swelling.       Left ankle: He exhibits swelling.  Lymphadenopathy:    He has no cervical adenopathy.  Neurological: He is alert.  Skin: Skin is warm. Nails show no clubbing.  Psychiatric: He has a normal mood and affect.       Data Reviewed: Basic Metabolic Panel:  Recent Labs Lab 03/08/16 1357 03/09/16 0439 03/12/16 0439 03/13/16 0438 03/13/16 1732  NA 128* 134* 129* 130*  --   K 4.0 3.6 4.0 3.7  --   CL 99* 99* 99* 98*  --   CO2 14* 27 27 26   --   GLUCOSE 515* 257* 109* 198* 419*  BUN 36* 34* 23* 26*  --   CREATININE 0.64 0.55* 0.44* 0.51*  --   CALCIUM 8.0* 9.4 8.1* 8.5*  --    Liver Function Tests:  Recent Labs Lab 03/08/16 1357  AST 31  ALT 18  ALKPHOS 68  BILITOT 1.2  PROT 5.6*  ALBUMIN 2.4*   CBC:  Recent Labs Lab 03/08/16 1357 03/09/16 0439 03/10/16 2326 03/11/16 1255 03/12/16 0439 03/13/16 0438  WBC 12.7* 12.6*  --  11.1* 9.4  --   HGB 12.7* 13.0 11.5* 10.3* 8.8* 8.9*  HCT 40.4 38.8*  --  30.5* 25.3*  --   MCV 96.5 92.4  --  92.4 90.9  --   PLT 171 177  --  172 156  --    Cardiac Enzymes:  Recent Labs Lab 03/08/16 1357 03/08/16 1726 03/08/16 2326 03/09/16 0439  TROPONINI 0.06* 0.16* 0.08* 0.14*   BNP (last 3 results)  Recent Labs  03/08/16 1357  BNP 258.0*    CBG:  Recent Labs Lab 03/13/16 0737 03/13/16 1146 03/13/16 1700 03/13/16 2113 03/14/16 [place]  150* 282* 441* 256* 89    Recent Results (from the past 240 hour(s))  Culture, blood (Routine X 2) w Reflex to ID Panel     Status: None   Collection Time: 03/08/16  2:40 PM  Result Value Ref Range Status   Specimen Description BLOOD LEFT WRIST  Final   Special Requests   Final    BOTTLES DRAWN AEROBIC AND ANAEROBIC  AERO 10CC ANA [place]   Culture NO GROWTH 5 DAYS  Final   Report Status 03/13/2016 FINAL  Final  Culture, blood (Routine X 2) w Reflex to ID Panel     Status: None   Collection Time: 03/08/16  2:43 PM  Result Value Ref Range Status   Specimen Description BLOOD LEFT ASSIST CONTROL  Final   Special Requests   Final    BOTTLES DRAWN AEROBIC AND ANAEROBIC  AERO 10CC ANA [place]   Culture NO GROWTH 5 DAYS  Final   Report Status 03/13/2016 FINAL  Final  Anaerobic culture      Status: None   Collection Time: 03/08/16  3:58 PM  Result Value Ref Range Status   Specimen Description BUTTOCKS  Final   Special Requests NONE  Final   Culture   Final    HEAVY GROWTH BACTEROIDES OVATUS BETA LACTAMASE NEGATIVE HEAVY GROWTH PREVOTELLA BIVIA BETA LACTAMASE POSITIVE    Report Status 03/13/2016 FINAL  Final  Wound culture     Status: None   Collection Time: 03/08/16  3:58 PM  Result Value Ref Range Status   Specimen Description BUTTOCKS  Final   Special Requests NONE  Final   Gram Stain   Final    FEW WBC SEEN MANY GRAM POSITIVE COCCI MANY GRAM NEGATIVE RODS FEW GRAM POSITIVE RODS    Culture   Final    HEAVY GROWTH MORGANELLA MORGANII HEAVY GROWTH PSEUDOMONAS AERUGINOSA HEAVY GROWTH PSEUDOMONAS SPECIES MODERATE GROWTH STAPHYLOCOCCUS AUREUS HEAVY GROWTH ENTEROCOCCUS FAECALIS    Report Status 03/13/2016 FINAL  Final   Organism ID, Bacteria PSEUDOMONAS AERUGINOSA  Final   Organism ID, Bacteria STAPHYLOCOCCUS AUREUS  Final   Organism ID, Bacteria ENTEROCOCCUS FAECALIS  Final   Organism ID, Bacteria MORGANELLA MORGANII  Final   Organism ID, Bacteria PSEUDOMONAS SPECIES  Final      Susceptibility   Staphylococcus aureus - MIC*    CIPROFLOXACIN <=0.5 SENSITIVE Sensitive     ERYTHROMYCIN <=0.25 SENSITIVE Sensitive     GENTAMICIN <=0.5 SENSITIVE Sensitive     OXACILLIN 0.5 SENSITIVE Sensitive     TETRACYCLINE <=1 SENSITIVE Sensitive     VANCOMYCIN 1 SENSITIVE Sensitive     TRIMETH/SULFA <=10 SENSITIVE Sensitive     CLINDAMYCIN <=0.25 SENSITIVE Sensitive     RIFAMPIN <=0.5 SENSITIVE Sensitive     Inducible Clindamycin NEGATIVE Sensitive     * MODERATE GROWTH STAPHYLOCOCCUS AUREUS   Morganella morganii - MIC*    AMPICILLIN/SULBACTAM Value in next row Resistant      RESISTANT>=36    PIP/TAZO Value in next row Sensitive      SENSITIVE<=4    CEFTAZIDIME Value in next row Sensitive      SENSITIVE<=1    CEFTRIAXONE Value in next row Sensitive      SENSITIVE<=1     CEFEPIME Value in next row Sensitive      SENSITIVE<=1    IMIPENEM Value in next row Sensitive      SENSITIVE1    GENTAMICIN Value in next row Sensitive      SENSITIVE<=1  CIPROFLOXACIN Value in next row Sensitive      SENSITIVE<=0.25    TRIMETH/SULFA Value in next row Sensitive      SENSITIVE<=20    * HEAVY GROWTH MORGANELLA MORGANII   Pseudomonas species - MIC*    CEFTAZIDIME Value in next row Sensitive      SENSITIVE4    CIPROFLOXACIN Value in next row Sensitive      SENSITIVE1    GENTAMICIN Value in next row Sensitive      SENSITIVE4    IMIPENEM Value in next row Sensitive      SENSITIVE<=0.25    PIP/TAZO Value in next row Sensitive      SENSITIVE<=4    * HEAVY GROWTH PSEUDOMONAS SPECIES   Pseudomonas aeruginosa - MIC*    CEFTAZIDIME Value in next row Sensitive      SENSITIVE<=4    CIPROFLOXACIN Value in next row Sensitive      SENSITIVE<=4    GENTAMICIN Value in next row Sensitive      SENSITIVE<=4    IMIPENEM Value in next row Sensitive      SENSITIVE<=4    CEFEPIME Value in next row Sensitive      SENSITIVE<=4    * HEAVY GROWTH PSEUDOMONAS AERUGINOSA   Enterococcus faecalis - MIC*    AMPICILLIN Value in next row Sensitive      SENSITIVE<=4    VANCOMYCIN Value in next row Sensitive      SENSITIVE<=4    GENTAMICIN SYNERGY Value in next row Sensitive      SENSITIVE<=4    LINEZOLID Value in next row Sensitive      SENSITIVE<=4    * HEAVY GROWTH ENTEROCOCCUS FAECALIS     Studies: No results found.  Scheduled Meds: . apixaban  10 mg Oral BID   Followed by  . [START ON 03/20/2016] apixaban  5 mg Oral BID  . atorvastatin  10 mg Oral QHS  . cholecalciferol  1,000 Units Oral Daily  . diatrizoate meglumine-sodium  15 mL Oral Once  . digoxin  0.125 mg Oral Daily  . feeding supplement (ENSURE ENLIVE)  237 mL Oral TID PC  . insulin aspart  0-20 Units Subcutaneous TID WC  . insulin aspart  0-5 Units Subcutaneous QHS  . insulin detemir  12 Units Subcutaneous QHS   . magnesium oxide  400 mg Oral BID  . meropenem (MERREM) IV  1 g Intravenous Q8H  . metoprolol succinate  25 mg Oral Daily  . multivitamin  1 tablet Oral Daily  . multivitamin with minerals  1 tablet Oral Daily  . sodium chloride  1,000 mL Intravenous Once  . sodium chloride flush  3 mL Intravenous Q12H  . tamsulosin  0.4 mg Oral QPC supper    Assessment/Plan:  1. Clinical sepsis. Source Due to sacral decubiti 2. Wound cultures with multi-organism growth. Continue meropenem I will ask infectious disease physician to come evaluate the patient due to multi-growth on cultures 3. Acute hemorrhagic anemia. Status post dose of iron. Hemoglobin currently stable. No evidence of hemorrhage Patient currently on Eliquis 4. Sacral decubiti. status post bedside debridement. Continue antibiotics  5. Severe bladder distention continue Foley as well as Flomax. 6. Bilateral lower pulmonary embolism with heart strain seen on CT scan. IVC filter placed patient back on eiliquis, monitor hgb 7. Atrial fibrillation with rapid ventricular response. Patient on digoxin, metoprolol. Heart rate stable 6.   Elevated troponin. Likely demand ischemia from rapid       ventricular response 7.  relative hypotension. Currently stable with changes in metoprolol dose 8.   Hyperlipidemia unspecified continue atorvastatin 9.   Type 2 diabetes blood glucose labile continue current dose of Levemir and insulin 10. Weakness. Patient will be going [facility], needs ID evaluation for antibiotic recommendations  Code Status:     Code Status Orders        Start     Ordered   03/08/16 1556  Full code   Continuous     03/08/16 1557    Code Status History    Date Active Date Inactive Code Status Order ID Comments User Context   This patient has a current code status but no historical code status.    Advance Directive Documentation        Most Recent Value   Type of Advance Directive  Healthcare Power of  Attorney, Living will   Pre-existing out of facility DNR order (yellow form or pink MOST form)     "MOST" Form in Place?       Family Communication: Spoke with son on the phone  Disposition Plan: Will need skilled nursing facility this week coming up  Antibiotics:  Vancomycin  Cefepime   Time spent: 25 minutes  [place], [facility]

## 2016-03-14 NOTE — Progress Notes (Signed)
Physical Therapy Treatment Patient Details Name: Paul Mcmahon MRN: [id number] DOB: 1925/01/20 Today's Date: 03/14/2016    History of Present Illness 82 y/o male with 4 months as consistent functional decline, he is admitted with sepsis.  Pt is profounfly weak, has multiple pressure ulcers and generally has been extremely functionally limited and essentially bed bound for 2+ months.    PT Comments    Patient demonstrates marked and significant atrophy globally with LUE swelling noted, contractures of bilateral UEs (fingers), LUE/LLE weakness compared to RUE. He is largely unable to assist with supine to sit transfer, and leans quite heavily onto L side while sitting. He is able to come to midline with cuing to bear weight through RUE. He demonstrates good motivation to complete there-ex today however he will require STR to recover any form of independent mobility.    Follow Up Recommendations  SNF     Equipment Recommendations       Recommendations for Other Services       Precautions / Restrictions Precautions Precautions: Fall Restrictions Weight Bearing Restrictions: No    Mobility  Bed Mobility Overal bed mobility: Needs Assistance Bed Mobility: Supine to Sit;Sit to Supine     Supine to sit: Total assist;+2 for physical assistance;Max assist Sit to supine: Total assist;+2 for physical assistance;Max assist   General bed mobility comments: Patient is generally completely deconditioned from multiple months of bedrest. Unable to provide much if any assistance in sitting.   Transfers                    Ambulation/Gait                 Stairs            Wheelchair Mobility    Modified Rankin (Stroke Patients Only)       Balance Overall balance assessment: Needs assistance Sitting-balance support: Single extremity supported Sitting balance-Leahy Scale: Zero   Postural control: Left lateral lean                           Cognition Arousal/Alertness: Awake/alert Behavior During Therapy: WFL for tasks assessed/performed Overall Cognitive Status: History of cognitive impairments - at baseline                      Exercises General Exercises - Lower Extremity Gluteal Sets: AROM;10 reps;Both (2 seats) Long Arc Quad: AROM;15 reps;Both;AAROM (2 Sets, more assistance required on LLE) Heel Slides: AROM;AAROM;Both;15 reps (2 sets) Hip ABduction/ADduction: AROM;AAROM;Both;10 reps;Supine (2 sets) Straight Leg Raises: AAROM;Both;10 reps (2 sets) Hip Flexion/Marching: AAROM;Both;10 reps (L required more assistance than R)    General Comments General comments (skin integrity, edema, etc.): Dressing for ulcerations on bilateral feet, atrophy of LLE thigh musculature.      Pertinent Vitals/Pain Pain Assessment:  (Reports mild medial R knee pain with repeated knee flexions.)    Home Living                      Prior Function            PT Goals (current goals can now be found in the care plan section) Acute Rehab PT Goals Patient Stated Goal: start doing something to get stronger PT Goal Formulation: With patient/family Time For Goal Achievement: 03/24/16 Potential to Achieve Goals: Fair Progress towards PT goals: Progressing toward goals    Frequency  Min 2X/week    PT  Plan Current plan remains appropriate    Co-evaluation             End of Session Equipment Utilized During Treatment: Gait belt Activity Tolerance: Patient limited by fatigue;Patient tolerated treatment well Patient left: in bed;with family/visitor present     Time: 1540-1611 PT Time Calculation (min) (ACUTE ONLY): 31 min  Charges:  $Therapeutic Exercise: 23-37 mins                    G Codes:      Kerman Passey, PT, DPT    03/14/2016, 4:23 PM

## 2016-03-14 NOTE — Consult Note (Signed)
[facility] Infectious Disease     Reason for Consult: Decubitus ulcer infection   Referring Physician: Serita Grit Date of Admission:  03/08/2016   Active Problems:   Sepsis ([place])   Pressure ulcer   Protein-calorie malnutrition, severe   Acute urinary tract infection   Atrial fibrillation with RVR ([place])   Decubitus ulcer   Failure to thrive (0-17)   HPI: Paul Mcmahon is a 82 y.o. male admitted 5/16 with AMS, weakness and decubitus ulcer. He reports increasing difficulty walking adn is largely bedbound and was living with his wife who has dementia.  He has a hx of DM, A fib and decub ulcer.  He has a foley cath placed since admission, with CT showing possible bladder outlet obstruction.  He has had a PE noted on this admission and has had IVC filter. Wound culture is mixed. Had bedside debridement by surgery. He has a 9x5 cm ulcer with necrotic area  Past Medical History  Diagnosis Date  . Diabetes mellitus without complication ([place])   . MI (myocardial infarction) ([place])   . A-fib ([place])   . Cancer [facility])    History reviewed. No pertinent past surgical history. Social History  Substance Use Topics  . Smoking status: Never Smoker   . Smokeless tobacco: None  . Alcohol Use: None   History reviewed. No pertinent family history.  Allergies:  Allergies  Allergen Reactions  . Penicillins Other (See Comments)    Reaction: Welts and blisters all over body    Current antibiotics: Antibiotics Given (last 72 hours)    Date/Time Action Medication Dose Rate   03/11/16 1530 Given  [given in specials]   clindamycin (CLEOCIN) 300 MG/50ML IVPB     03/11/16 2135 Given   vancomycin (VANCOCIN) IVPB 1000 mg/200 mL premix 1,000 mg 200 mL/hr   03/11/16 2318 Given   ceFEPIme (MAXIPIME) 2 g in dextrose 5 % 50 mL IVPB 2 g 100 mL/hr   03/12/16 1037 Given   vancomycin (VANCOCIN) IVPB 1000 mg/200 mL premix 1,000 mg 200 mL/hr   03/12/16 1219 Given   ceFEPIme (MAXIPIME) 2 g in dextrose 5 % 50 mL  IVPB 2 g 100 mL/hr   03/12/16 2219 Given   vancomycin (VANCOCIN) IVPB 1000 mg/200 mL premix 1,000 mg 200 mL/hr   03/12/16 2219 Given   ceFEPIme (MAXIPIME) 2 g in dextrose 5 % 50 mL IVPB 2 g 100 mL/hr   03/13/16 0919 Given   vancomycin (VANCOCIN) IVPB 1000 mg/200 mL premix 1,000 mg 200 mL/hr   03/13/16 1000 Given   ceFEPIme (MAXIPIME) 2 g in dextrose 5 % 50 mL IVPB 2 g 100 mL/hr   03/13/16 1400 Given   meropenem (MERREM) 1 g in sodium chloride 0.9 % 100 mL IVPB 1 g 200 mL/hr   03/13/16 2151 Given   meropenem (MERREM) 1 g in sodium chloride 0.9 % 100 mL IVPB 1 g 200 mL/hr   03/14/16 0535 Given   meropenem (MERREM) 1 g in sodium chloride 0.9 % 100 mL IVPB 1 g 200 mL/hr   03/14/16 1300 Given   meropenem (MERREM) 1 g in sodium chloride 0.9 % 100 mL IVPB 1 g 200 mL/hr      MEDICATIONS: . apixaban  10 mg Oral BID   Followed by  . [START ON 03/20/2016] apixaban  5 mg Oral BID  . atorvastatin  10 mg Oral QHS  . cholecalciferol  1,000 Units Oral Daily  . diatrizoate meglumine-sodium  15 mL Oral Once  .  digoxin  0.125 mg Oral Daily  . feeding supplement (ENSURE ENLIVE)  237 mL Oral TID PC  . insulin aspart  0-20 Units Subcutaneous TID WC  . insulin aspart  0-5 Units Subcutaneous QHS  . insulin detemir  12 Units Subcutaneous QHS  . magnesium oxide  400 mg Oral BID  . meropenem (MERREM) IV  1 g Intravenous Q8H  . metoprolol succinate  25 mg Oral Daily  . multivitamin  1 tablet Oral Daily  . multivitamin with minerals  1 tablet Oral Daily  . sodium chloride  1,000 mL Intravenous Once  . sodium chloride flush  3 mL Intravenous Q12H  . tamsulosin  0.4 mg Oral QPC supper    Review of Systems - 11 systems reviewed and negative per HPI   OBJECTIVE: Temp:  [97.8 F (36.6 C)-98.8 F (37.1 C)] 97.8 F (36.6 C) (05/22 1115) Pulse Rate:  [76-86] 76 (05/22 1115) Resp:  [16-20] 16 (05/22 1115) BP: (103-120)/(52-76) 116/70 mmHg (05/22 1115) SpO2:  [95 %-99 %] 98 % (05/22 1115) Physical Exam   Constitutional: He is oriented to person, place, and time. Frail HENT: anicteric Mouth/Throat: Oropharynx is clear and moist. No oropharyngeal exudate.  Cardiovascular: Normal rate, regular rhythm and normal heart sounds.  Pulmonary/Chest: Effort normal and breath sounds normal. No respiratory distress. He has no wheezes.  Abdominal: Soft. Bowel sounds are normal. He exhibits no distension. There is no tenderness.  Lymphadenopathy: He has no cervical adenopathy.  Neurological: He is alert and oriented to person, place, and time. bil LE weakness diffuses Skin: Skin is warm and dry. Sacral decub dressed Psychiatric: He has a normal mood and affect. His behavior is normal.  LABS: Results for orders placed or performed during the hospital encounter of 03/08/16 (from the past 48 hour(s))  Glucose, capillary     Status: Abnormal   Collection Time: 03/12/16  4:22 PM  Result Value Ref Range   Glucose-Capillary 388 (H) 65 - 99 mg/dL  Glucose, capillary     Status: Abnormal   Collection Time: 03/12/16  9:35 PM  Result Value Ref Range   Glucose-Capillary 384 (H) 65 - 99 mg/dL  Hemoglobin     Status: Abnormal   Collection Time: 03/13/16  4:38 AM  Result Value Ref Range   Hemoglobin 8.9 (L) 13.0 - 18.0 g/dL  Basic metabolic panel     Status: Abnormal   Collection Time: 03/13/16  4:38 AM  Result Value Ref Range   Sodium 130 (L) 135 - 145 mmol/L   Potassium 3.7 3.5 - 5.1 mmol/L   Chloride 98 (L) 101 - 111 mmol/L   CO2 26 22 - 32 mmol/L   Glucose, Bld 198 (H) 65 - 99 mg/dL   BUN 26 (H) 6 - 20 mg/dL   Creatinine, Ser 0.51 (L) 0.61 - 1.24 mg/dL   Calcium 8.5 (L) 8.9 - 10.3 mg/dL   GFR calc non Af Amer >60 >60 mL/min   GFR calc Af Amer >60 >60 mL/min    Comment: (NOTE) The eGFR has been calculated using the CKD EPI equation. This calculation has not been validated in all clinical situations. eGFR's persistently <60 mL/min signify possible Chronic Kidney Disease.    Anion gap 6 5 - 15   Glucose, capillary     Status: Abnormal   Collection Time: 03/13/16  7:37 AM  Result Value Ref Range   Glucose-Capillary 150 (H) 65 - 99 mg/dL  Glucose, capillary     Status: Abnormal  Collection Time: 03/13/16 11:46 AM  Result Value Ref Range   Glucose-Capillary 282 (H) 65 - 99 mg/dL  Glucose, capillary     Status: Abnormal   Collection Time: 03/13/16  5:00 PM  Result Value Ref Range   Glucose-Capillary 441 (H) 65 - 99 mg/dL  Glucose, random     Status: Abnormal   Collection Time: 03/13/16  5:32 PM  Result Value Ref Range   Glucose, Bld 419 (H) 65 - 99 mg/dL  Glucose, capillary     Status: Abnormal   Collection Time: 03/13/16  9:13 PM  Result Value Ref Range   Glucose-Capillary 256 (H) 65 - 99 mg/dL  Glucose, capillary     Status: None   Collection Time: 03/14/16  7:37 AM  Result Value Ref Range   Glucose-Capillary 89 65 - 99 mg/dL  Glucose, capillary     Status: Abnormal   Collection Time: 03/14/16 11:22 AM  Result Value Ref Range   Glucose-Capillary 185 (H) 65 - 99 mg/dL   No components found for: ESR, C REACTIVE PROTEIN MICRO: Recent Results (from the past 720 hour(s))  Culture, blood (Routine X 2) w Reflex to ID Panel     Status: None   Collection Time: 03/08/16  2:40 PM  Result Value Ref Range Status   Specimen Description BLOOD LEFT WRIST  Final   Special Requests   Final    BOTTLES DRAWN AEROBIC AND ANAEROBIC  AERO 10CC ANA 2CC   Culture NO GROWTH 5 DAYS  Final   Report Status 03/13/2016 FINAL  Final  Culture, blood (Routine X 2) w Reflex to ID Panel     Status: None   Collection Time: 03/08/16  2:43 PM  Result Value Ref Range Status   Specimen Description BLOOD LEFT ASSIST CONTROL  Final   Special Requests   Final    BOTTLES DRAWN AEROBIC AND ANAEROBIC  AERO 10CC ANA [place]   Culture NO GROWTH 5 DAYS  Final   Report Status 03/13/2016 FINAL  Final  Anaerobic culture     Status: None   Collection Time: 03/08/16  3:58 PM  Result Value Ref Range Status    Specimen Description BUTTOCKS  Final   Special Requests NONE  Final   Culture   Final    HEAVY GROWTH BACTEROIDES OVATUS BETA LACTAMASE NEGATIVE HEAVY GROWTH PREVOTELLA BIVIA BETA LACTAMASE POSITIVE    Report Status 03/13/2016 FINAL  Final  Wound culture     Status: None   Collection Time: 03/08/16  3:58 PM  Result Value Ref Range Status   Specimen Description BUTTOCKS  Final   Special Requests NONE  Final   Gram Stain   Final    FEW WBC SEEN MANY GRAM POSITIVE COCCI MANY GRAM NEGATIVE RODS FEW GRAM POSITIVE RODS    Culture   Final    HEAVY GROWTH MORGANELLA MORGANII HEAVY GROWTH PSEUDOMONAS AERUGINOSA HEAVY GROWTH PSEUDOMONAS SPECIES MODERATE GROWTH STAPHYLOCOCCUS AUREUS HEAVY GROWTH ENTEROCOCCUS FAECALIS    Report Status 03/13/2016 FINAL  Final   Organism ID, Bacteria PSEUDOMONAS AERUGINOSA  Final   Organism ID, Bacteria STAPHYLOCOCCUS AUREUS  Final   Organism ID, Bacteria ENTEROCOCCUS FAECALIS  Final   Organism ID, Bacteria MORGANELLA MORGANII  Final   Organism ID, Bacteria PSEUDOMONAS SPECIES  Final      Susceptibility   Staphylococcus aureus - MIC*    CIPROFLOXACIN <=0.5 SENSITIVE Sensitive     ERYTHROMYCIN <=0.25 SENSITIVE Sensitive     GENTAMICIN <=0.5 SENSITIVE Sensitive  OXACILLIN 0.5 SENSITIVE Sensitive     TETRACYCLINE <=1 SENSITIVE Sensitive     VANCOMYCIN 1 SENSITIVE Sensitive     TRIMETH/SULFA <=10 SENSITIVE Sensitive     CLINDAMYCIN <=0.25 SENSITIVE Sensitive     RIFAMPIN <=0.5 SENSITIVE Sensitive     Inducible Clindamycin NEGATIVE Sensitive     * MODERATE GROWTH STAPHYLOCOCCUS AUREUS   Morganella morganii - MIC*    AMPICILLIN/SULBACTAM Value in next row Resistant      RESISTANT>=36    PIP/TAZO Value in next row Sensitive      SENSITIVE<=4    CEFTAZIDIME Value in next row Sensitive      SENSITIVE<=1    CEFTRIAXONE Value in next row Sensitive      SENSITIVE<=1    CEFEPIME Value in next row Sensitive      SENSITIVE<=1    IMIPENEM Value in next  row Sensitive      SENSITIVE1    GENTAMICIN Value in next row Sensitive      SENSITIVE<=1    CIPROFLOXACIN Value in next row Sensitive      SENSITIVE<=0.25    TRIMETH/SULFA Value in next row Sensitive      SENSITIVE<=20    * HEAVY GROWTH MORGANELLA MORGANII   Pseudomonas species - MIC*    CEFTAZIDIME Value in next row Sensitive      SENSITIVE4    CIPROFLOXACIN Value in next row Sensitive      SENSITIVE1    GENTAMICIN Value in next row Sensitive      SENSITIVE4    IMIPENEM Value in next row Sensitive      SENSITIVE<=0.25    PIP/TAZO Value in next row Sensitive      SENSITIVE<=4    * HEAVY GROWTH PSEUDOMONAS SPECIES   Pseudomonas aeruginosa - MIC*    CEFTAZIDIME Value in next row Sensitive      SENSITIVE<=4    CIPROFLOXACIN Value in next row Sensitive      SENSITIVE<=4    GENTAMICIN Value in next row Sensitive      SENSITIVE<=4    IMIPENEM Value in next row Sensitive      SENSITIVE<=4    CEFEPIME Value in next row Sensitive      SENSITIVE<=4    * HEAVY GROWTH PSEUDOMONAS AERUGINOSA   Enterococcus faecalis - MIC*    AMPICILLIN Value in next row Sensitive      SENSITIVE<=4    VANCOMYCIN Value in next row Sensitive      SENSITIVE<=4    GENTAMICIN SYNERGY Value in next row Sensitive      SENSITIVE<=4    LINEZOLID Value in next row Sensitive      SENSITIVE<=4    * HEAVY GROWTH ENTEROCOCCUS FAECALIS    IMAGING: Ct Angio Chest Pe W/cm &/or Wo Cm  03/11/2016  CLINICAL DATA:  History of pulmonary embolus. EXAM: CT ANGIOGRAPHY CHEST WITH CONTRAST TECHNIQUE: Multidetector CT imaging of the chest was performed using the standard protocol during bolus administration of intravenous contrast. Multiplanar CT image reconstructions and MIPs were obtained to evaluate the vascular anatomy. CONTRAST:  100 mL of Isovue 370 intravenously. COMPARISON:  CT scan of Mar 08, 2016. FINDINGS: Severe multilevel degenerative disc disease is noted throughout the thoracic spine. No pneumothorax is noted.  Mild bilateral pleural effusions are noted with adjacent subsegmental atelectasis. Mild inflammatory changes or atelectasis or scar are noted anteriorly in the lingular segment of the left upper lobe. There are again noted large acute emboli in the lower lobe branches of both  pulmonary arteries as described on prior exam. No acute abnormality seen in the visualized portion of the upper abdomen. 4.3 cm ascending thoracic aortic aneurysm is noted. Coronary artery calcifications are noted. No significant mediastinal mass or adenopathy is noted. RV/LV ratio of greater than 1.5 is noted suggesting right heart strain. Review of the MIP images confirms the above findings. IMPRESSION: Large bilateral pulmonary emboli are again noted as described on prior exam. RV/LV ratio of greater than 1.5 is noted suggesting right heart strain. Mild bilateral pleural effusions are noted with adjacent subsegmental atelectasis. Focal opacity is noted anteriorly in the lingular segment of the left upper lobe concerning for atelectasis, scarring or possibly inflammation. Coronary artery calcifications are noted suggesting Coronary artery disease. 4.3 cm ascending thoracic aortic aneurysm is noted. Recommend annual imaging followup by CTA or MRA. This recommendation follows 2010 ACCF/AHA/AATS/ACR/ASA/SCA/SCAI/SIR/STS/SVM Guidelines for the Diagnosis and Management of Patients with Thoracic Aortic Disease. Circulation. 2010; 121: E268-T419. Electronically Signed   By: Marijo Conception, M.D.   On: 03/11/2016 12:27   Ct Abdomen Pelvis W Contrast  03/08/2016  ADDENDUM REPORT: 03/08/2016 19:21 ADDENDUM: Further imaging has now been obtained to evaluate the perineal and scrotal area. Mild scrotal thickening is noted although no abscess is seen. No other focal abnormality is seen. Electronically Signed   By: Inez Catalina M.D.   On: 03/08/2016 19:21  03/08/2016  CLINICAL DATA:  Incontinence stools and urine with left-sided swelling EXAM: CT  ABDOMEN AND PELVIS WITH CONTRAST TECHNIQUE: Multidetector CT imaging of the abdomen and pelvis was performed using the standard protocol following bolus administration of intravenous contrast. CONTRAST:  116m ISOVUE-300 IOPAMIDOL (ISOVUE-300) INJECTION 61% COMPARISON:  None. FINDINGS: Lung bases demonstrate no evidence focal infiltrate or sizable effusion. There are however changes consistent with bilateral pulmonary emboli. The right ventricle is somewhat enlarged consistent with a degree of right heart strain. Some minimal atelectatic changes in the right lower lobe are seen. The liver, gallbladder, spleen, adrenal glands and pancreas are within normal limits. Kidneys are well visualized bilaterally and demonstrate bilateral renal cystic change. Prominence of the right renal collecting system is noted although no obstructing stone is seen. This is likely related to the over distension of the urinary bladder. Similar changes are noted on the left. Aortoiliac calcifications are identified. No pelvic mass lesion or sidewall abnormality is noted. Changes of prior prostate brachytherapy are seen. The appendix is not visualized although no inflammatory changes are seen. The bony structures show degenerative change of the lumbar spine. No acute compression deformity is seen. Soft tissue swelling is noted in the left lower extremity likely related to deep venous thrombosis given the pulmonary emboli. Further evaluation by means of ultrasound can be performed as clinically indicated. By history there is a scrotal/perineal wound which was incompletely evaluated on this scan. Attempts to retrieve the patient for further scanning have been unsuccessful to this point. IMPRESSION: Bilateral lower lobe pulmonary emboli. There are changes signifying right heart strain consistent with increased morbidity and mortality. Bilateral renal cystic change. Significantly distended urinary bladder. This may reflect a degree of bladder  outlet obstruction. Compensatory dilatation of the collecting systems is noted bilaterally. Left lower extremity swelling likely related to deep venous thrombosis. By history there is a scrotal/perineal wound. Attempts to retrieve the patient for additional imaging were unsuccessful to this point due to Clinical needs on the floor. These results were called by telephone at the time of interpretation on 03/08/2016 at 6:06 pm to Dr. DShanon Brow  HOWER , who verbally acknowledged these results. Electronically Signed: By: Inez Catalina M.D. On: 03/08/2016 18:14   US Venous Img Lower Bilateral  03/10/2016  CLINICAL DATA:  Pulmonary embolism. EXAM: BILATERAL LOWER EXTREMITY VENOUS DOPPLER ULTRASOUND TECHNIQUE: Gray-scale sonography with graded compression, as well as color Doppler and duplex ultrasound were performed to evaluate the lower extremity deep venous systems from the level of the common femoral vein and including the common femoral, femoral, profunda femoral, popliteal and calf veins including the posterior tibial, peroneal and gastrocnemius veins when visible. The superficial great saphenous vein was also interrogated. Spectral Doppler was utilized to evaluate flow at rest and with distal augmentation maneuvers in the common femoral, femoral and popliteal veins. COMPARISON:  CT 03/08/2016.  Ultrasound 06/21/2005. FINDINGS: RIGHT LOWER EXTREMITY Common Femoral Vein: No evidence of thrombus. Normal compressibility, respiratory phasicity and response to augmentation. Saphenofemoral Junction: No evidence of thrombus. Normal compressibility and flow on color Doppler imaging. Profunda Femoral Vein: No evidence of thrombus. Normal compressibility and flow on color Doppler imaging. Femoral Vein: No evidence of thrombus. Normal compressibility, respiratory phasicity and response to augmentation. Popliteal Vein: No evidence of thrombus. Normal compressibility, respiratory phasicity and response to augmentation. Calf Veins: No  evidence of thrombus. Normal compressibility and flow on color Doppler imaging. Superficial Great Saphenous Vein: No evidence of thrombus. Normal compressibility and flow on color Doppler imaging. Other Findings:  None. LEFT LOWER EXTREMITY Common Femoral Vein: No evidence of thrombus. Normal compressibility, respiratory phasicity and response to augmentation. Saphenofemoral Junction: No evidence of thrombus. Normal compressibility and flow on color Doppler imaging. Profunda Femoral Vein: No evidence of thrombus. Normal compressibility and flow on color Doppler imaging. Femoral Vein: Nonocclusive thrombus noted. Popliteal Vein: No evidence of thrombus. Normal compressibility, respiratory phasicity and response to augmentation. Calf Veins: No evidence of thrombus. Normal compressibility and flow on color Doppler imaging. Superficial Great Saphenous Vein: No evidence of thrombus. Normal compressibility and flow on color Doppler imaging. Other Findings:  None. IMPRESSION: Nonocclusive thrombus in the left femoral vein. Electronically Signed   By: Marcello Moores  Register   On: 03/10/2016 06:56   Dg Chest Portable 1 View  03/08/2016  CLINICAL DATA:  Lower extremity edema. EXAM: PORTABLE CHEST 1 VIEW COMPARISON:  01/12/2009 FINDINGS: The heart size and mediastinal contours are within normal limits. Both lungs are clear. The visualized skeletal structures are unremarkable. IMPRESSION: No active disease. Electronically Signed   By: Kathreen Devoid   On: 03/08/2016 14:54    Assessment:   Paul Mcmahon is a 82 y.o. male with several months of progressive decline now largely bedbound, developing a sacral decubitus ulcer with mixed cultures. S/p bedside debridement 5/19 by surgery and getting Dakins solution applied.    He is PCN allergic.   Recommendations Would recommend further evaluation for cause of his progressive decline - per Dr Yetta Numbers note 11/16/15 he was considering neurology consultation for further evaluation.  ?  Need Brain or spine imaging.  For abx seleciton would favor cipro 500 mg bid, doxy 100 bid and flaygl 500 bid  for 2  week course with reassessment at [place] at that time based on how the wound is doing.   This will cover the Pseudomonas, MSSA and the Morganella as well as the anaerobes identified.  The enterococcus may be covered by the cipro and doxy as well but not tested. Cannot use amox due to his allergy  Will need wound care and is being discharged to [facility] and can be  monitored there.    Thank you very much for allowing me to participate in the care of this patient. Please call with questions.   Cheral Marker. Ola Spurr, MD

## 2016-03-14 NOTE — Progress Notes (Signed)
Inpatient Diabetes Program Recommendations  AACE/ADA: New Consensus Statement on Inpatient Glycemic Control (2015)  Target Ranges:  Prepandial:   less than 140 mg/dL      [facility] postprandial:   less than 180 mg/dL (1-2 hours)      Critically ill patients:  140 - 180 mg/dL   Review of Glycemic ControlResults for Paul Mcmahon, Paul Mcmahon (MRN [id number]) as of 03/14/2016 09:36  Ref. Range 03/13/2016 07:37 03/13/2016 11:46 03/13/2016 17:00 03/13/2016 21:13 03/14/2016 07:37  Glucose-Capillary Latest Ref Range: 65-99 mg/dL 150 (H) 282 (H) 441 (H) 256 (H) 89   Diabetes history: Type 2 diabetes Outpatient Diabetes medications: Metformin 750 mg tid Current orders for Inpatient glycemic control:  Novolog resistant tid with meals and HS, Levemir 12 units q HS  Inpatient Diabetes Program Recommendations:  Please reduce Novolog correction to moderate tid with meals.  Also consider adding Novolog meal coverage 3 units tid with meals.   Thanks, Adah Perl, RN, BC-ADM Inpatient Diabetes Coordinator Pager [phone] (8a-5p)

## 2016-03-14 NOTE — Progress Notes (Signed)
CSW spoke to MD Posey Pronto about patient's discharge plans. Per MD patient to possibly discharge to [facility] tomorrow 03/14/16 depending on medical stability. CSW updated [facility] Admissions Coordinator at [facility] of above. CSW will continue to follow and assist.   Ernest Pine, MSW, [place] Work Department [phone]

## 2016-03-15 LAB — BASIC METABOLIC PANEL
ANION GAP: 4 — AB (ref 5–15)
BUN: 23 mg/dL — ABNORMAL HIGH (ref 6–20)
CALCIUM: 8.7 mg/dL — AB (ref 8.9–10.3)
CO2: 32 mmol/L (ref 22–32)
CREATININE: 0.47 mg/dL — AB (ref 0.61–1.24)
Chloride: 94 mmol/L — ABNORMAL LOW (ref 101–111)
Glucose, Bld: 291 mg/dL — ABNORMAL HIGH (ref 65–99)
Potassium: 4.7 mmol/L (ref 3.5–5.1)
SODIUM: 130 mmol/L — AB (ref 135–145)

## 2016-03-15 LAB — CBC
HCT: 24.7 % — ABNORMAL LOW (ref 40.0–52.0)
Hemoglobin: 8.3 g/dL — ABNORMAL LOW (ref 13.0–18.0)
MCH: 31.3 pg (ref 26.0–34.0)
MCHC: 33.5 g/dL (ref 32.0–36.0)
MCV: 93.4 fL (ref 80.0–100.0)
PLATELETS: 244 10*3/uL (ref 150–440)
RBC: 2.65 MIL/uL — ABNORMAL LOW (ref 4.40–5.90)
RDW: 14.4 % (ref 11.5–14.5)
WBC: 7.5 10*3/uL (ref 3.8–10.6)

## 2016-03-15 LAB — GLUCOSE, CAPILLARY: Glucose-Capillary: 237 mg/dL — ABNORMAL HIGH (ref 65–99)

## 2016-03-15 MED ORDER — APIXABAN 5 MG PO TABS: 10.0000 mg | ORAL_TABLET | Freq: Two times a day (BID) | ORAL | Status: DC

## 2016-03-15 MED ORDER — INSULIN DETEMIR 100 UNIT/ML ~~LOC~~ SOLN
12.0000 [IU] | Freq: Every day | SUBCUTANEOUS | Status: DC
Start: 2016-03-15 — End: 2016-05-27

## 2016-03-15 MED ORDER — DOXYCYCLINE HYCLATE 100 MG PO CAPS: 100.0000 mg | ORAL_CAPSULE | Freq: Two times a day (BID) | ORAL | Status: AC

## 2016-03-15 MED ORDER — OXYCODONE HCL 5 MG PO TABS
5.0000 mg | ORAL_TABLET | ORAL | Status: DC | PRN
Start: 2016-03-15 — End: 2016-06-02

## 2016-03-15 MED ORDER — ENSURE ENLIVE PO LIQD: 237.0000 mL | Freq: Three times a day (TID) | ORAL | Status: DC

## 2016-03-15 MED ORDER — INSULIN ASPART 100 UNIT/ML ~~LOC~~ SOLN: 3.0000 [IU] | Freq: Three times a day (TID) | SUBCUTANEOUS | Status: DC

## 2016-03-15 MED ORDER — CIPROFLOXACIN HCL 500 MG PO TABS: 500.0000 mg | ORAL_TABLET | Freq: Two times a day (BID) | ORAL | Status: AC

## 2016-03-15 MED ORDER — TAMSULOSIN HCL 0.4 MG PO CAPS: 0.4000 mg | ORAL_CAPSULE | Freq: Every day | ORAL | Status: DC

## 2016-03-15 MED ORDER — ONDANSETRON HCL 4 MG PO TABS: 4.0000 mg | ORAL_TABLET | Freq: Four times a day (QID) | ORAL | Status: DC | PRN

## 2016-03-15 MED ORDER — METRONIDAZOLE 500 MG PO TABS: 500.0000 mg | ORAL_TABLET | Freq: Three times a day (TID) | ORAL | Status: AC

## 2016-03-15 MED ORDER — ACETAMINOPHEN 325 MG PO TABS: 650.0000 mg | ORAL_TABLET | Freq: Four times a day (QID) | ORAL | Status: DC | PRN

## 2016-03-15 MED ORDER — APIXABAN 5 MG PO TABS: 5.0000 mg | ORAL_TABLET | Freq: Two times a day (BID) | ORAL | Status: DC

## 2016-03-15 NOTE — Progress Notes (Signed)
Patient is being discharged to [facility]. Report given to Zigmund Daniel, Therapist, sports. EMS has been called. Will continue to monitor [place] Finer

## 2016-03-15 NOTE — Discharge Summary (Signed)
Paul Mcmahon, 82 y.o., DOB Dec 29, 1924, MRN [id number]. Admission date: 03/08/2016 Discharge Date 03/15/2016 Primary MD DAVID Lovie Macadamia, MD Admitting Physician Lytle Butte, MD  Admission Diagnosis  Decubitus ulcer [L89.90] Wound infection ([place]) [T14.8, L08.9] Acute urinary tract infection [N39.0] Failure to thrive (0-17) [R62.51] Atrial fibrillation with RVR ([place]) [I48.91] Sepsis ([place]) [A41.9] Uncontrolled type 1 diabetes mellitus without complication ([place]) 0000000  Discharge Diagnosis   Active Problems:   Sepsis ([place]) due to decubitus ulcer   Decubitus ulcer   Protein-calorie malnutrition, severe   Acute urinary tract infection   Atrial fibrillation with RVR ([facility])   Failure to thrive (0-17)  Urinary retention  Acute hemorrhagic anemia from the decubitus ulcer Bilateral pulmonary embolism with heart strain status post IVC filter placement Elevated troponin due to demand ischemia Diabetes type 2       Hospital Course 82 yr old male who comes in with altered mental status changes to the emergency room. He was noted to have a decubitus ulcer with evidence of infection. Patient was admitted for sepsis related to the decubitus ulcer. He was also noticed to have a urinary tract infection as well. Patient was started on broad-spectrum antibiotics. He was seen in consultation by surgery and had debridement of the wound. Wound culture showed multiple organisms. He was seen in consultation by infectious disease who recommended 2 weeks of oral antibiotics. Based on how the wound looks he may need further treatment. Patient also noted to have pulmonary embolism with heart strain. Patient was on Adoxa ban at home. He was started on Eliquis. He subsequently had bleeding from the decubitus ulcer requiring blood transfusion. He also required iron infusion. He was seen by vascular surgery and had a IVC filter placed. Patient has multiple medical issues. And as his very poor prognosis. He will need  continued care for his decubitus ulcer. Recommend he be followed by palliative care team to address his CODE STATUS. We'll keep Foley in place will need voiding trials.          Consults  cardiology, infections disease, vascular surgery  Significant Tests:  See full reports for all details    Ct Angio Chest Pe W/cm &/or Wo Cm  03/11/2016  CLINICAL DATA:  History of pulmonary embolus. EXAM: CT ANGIOGRAPHY CHEST WITH CONTRAST TECHNIQUE: Multidetector CT imaging of the chest was performed using the standard protocol during bolus administration of intravenous contrast. Multiplanar CT image reconstructions and MIPs were obtained to evaluate the vascular anatomy. CONTRAST:  100 mL of Isovue 370 intravenously. COMPARISON:  CT scan of Mar 08, 2016. FINDINGS: Severe multilevel degenerative disc disease is noted throughout the thoracic spine. No pneumothorax is noted. Mild bilateral pleural effusions are noted with adjacent subsegmental atelectasis. Mild inflammatory changes or atelectasis or scar are noted anteriorly in the lingular segment of the left upper lobe. There are again noted large acute emboli in the lower lobe branches of both pulmonary arteries as described on prior exam. No acute abnormality seen in the visualized portion of the upper abdomen. 4.3 cm ascending thoracic aortic aneurysm is noted. Coronary artery calcifications are noted. No significant mediastinal mass or adenopathy is noted. RV/LV ratio of greater than 1.5 is noted suggesting right heart strain. Review of the MIP images confirms the above findings. IMPRESSION: Large bilateral pulmonary emboli are again noted as described on prior exam. RV/LV ratio of greater than 1.5 is noted suggesting right heart strain. Mild bilateral pleural effusions are noted with adjacent subsegmental atelectasis. Focal opacity is  noted anteriorly in the lingular segment of the left upper lobe concerning for atelectasis, scarring or possibly inflammation.  Coronary artery calcifications are noted suggesting Coronary artery disease. 4.3 cm ascending thoracic aortic aneurysm is noted. Recommend annual imaging followup by CTA or MRA. This recommendation follows 2010 ACCF/AHA/AATS/ACR/ASA/SCA/SCAI/SIR/STS/SVM Guidelines for the Diagnosis and Management of Patients with Thoracic Aortic Disease. Circulation. 2010; [id number]. Electronically Signed   By: Marijo Conception, M.D.   On: 03/11/2016 12:27   Ct Abdomen Pelvis W Contrast  03/08/2016  ADDENDUM REPORT: 03/08/2016 19:21 ADDENDUM: Further imaging has now been obtained to evaluate the perineal and scrotal area. Mild scrotal thickening is noted although no abscess is seen. No other focal abnormality is seen. Electronically Signed   By: Inez Catalina M.D.   On: 03/08/2016 19:21  03/08/2016  CLINICAL DATA:  Incontinence stools and urine with left-sided swelling EXAM: CT ABDOMEN AND PELVIS WITH CONTRAST TECHNIQUE: Multidetector CT imaging of the abdomen and pelvis was performed using the standard protocol following bolus administration of intravenous contrast. CONTRAST:  185mL ISOVUE-300 IOPAMIDOL (ISOVUE-300) INJECTION 61% COMPARISON:  None. FINDINGS: Lung bases demonstrate no evidence focal infiltrate or sizable effusion. There are however changes consistent with bilateral pulmonary emboli. The right ventricle is somewhat enlarged consistent with a degree of right heart strain. Some minimal atelectatic changes in the right lower lobe are seen. The liver, gallbladder, spleen, adrenal glands and pancreas are within normal limits. Kidneys are well visualized bilaterally and demonstrate bilateral renal cystic change. Prominence of the right renal collecting system is noted although no obstructing stone is seen. This is likely related to the over distension of the urinary bladder. Similar changes are noted on the left. Aortoiliac calcifications are identified. No pelvic mass lesion or sidewall abnormality is noted.  Changes of prior prostate brachytherapy are seen. The appendix is not visualized although no inflammatory changes are seen. The bony structures show degenerative change of the lumbar spine. No acute compression deformity is seen. Soft tissue swelling is noted in the left lower extremity likely related to deep venous thrombosis given the pulmonary emboli. Further evaluation by means of ultrasound can be performed as clinically indicated. By history there is a scrotal/perineal wound which was incompletely evaluated on this scan. Attempts to retrieve the patient for further scanning have been unsuccessful to this point. IMPRESSION: Bilateral lower lobe pulmonary emboli. There are changes signifying right heart strain consistent with increased morbidity and mortality. Bilateral renal cystic change. Significantly distended urinary bladder. This may reflect a degree of bladder outlet obstruction. Compensatory dilatation of the collecting systems is noted bilaterally. Left lower extremity swelling likely related to deep venous thrombosis. By history there is a scrotal/perineal wound. Attempts to retrieve the patient for additional imaging were unsuccessful to this point due to Clinical needs on the floor. These results were called by telephone at the time of interpretation on 03/08/2016 at 6:06 pm to Dr. Valentino Nose , who verbally acknowledged these results. Electronically Signed: By: Inez Catalina M.D. On: 03/08/2016 18:14   US Venous Img Lower Bilateral  03/10/2016  CLINICAL DATA:  Pulmonary embolism. EXAM: BILATERAL LOWER EXTREMITY VENOUS DOPPLER ULTRASOUND TECHNIQUE: Gray-scale sonography with graded compression, as well as color Doppler and duplex ultrasound were performed to evaluate the lower extremity deep venous systems from the level of the common femoral vein and including the common femoral, femoral, profunda femoral, popliteal and calf veins including the posterior tibial, peroneal and gastrocnemius veins  when visible. The superficial great saphenous vein  was also interrogated. Spectral Doppler was utilized to evaluate flow at rest and with distal augmentation maneuvers in the common femoral, femoral and popliteal veins. COMPARISON:  CT 03/08/2016.  Ultrasound 06/21/2005. FINDINGS: RIGHT LOWER EXTREMITY Common Femoral Vein: No evidence of thrombus. Normal compressibility, respiratory phasicity and response to augmentation. Saphenofemoral Junction: No evidence of thrombus. Normal compressibility and flow on color Doppler imaging. Profunda Femoral Vein: No evidence of thrombus. Normal compressibility and flow on color Doppler imaging. Femoral Vein: No evidence of thrombus. Normal compressibility, respiratory phasicity and response to augmentation. Popliteal Vein: No evidence of thrombus. Normal compressibility, respiratory phasicity and response to augmentation. Calf Veins: No evidence of thrombus. Normal compressibility and flow on color Doppler imaging. Superficial Great Saphenous Vein: No evidence of thrombus. Normal compressibility and flow on color Doppler imaging. Other Findings:  None. LEFT LOWER EXTREMITY Common Femoral Vein: No evidence of thrombus. Normal compressibility, respiratory phasicity and response to augmentation. Saphenofemoral Junction: No evidence of thrombus. Normal compressibility and flow on color Doppler imaging. Profunda Femoral Vein: No evidence of thrombus. Normal compressibility and flow on color Doppler imaging. Femoral Vein: Nonocclusive thrombus noted. Popliteal Vein: No evidence of thrombus. Normal compressibility, respiratory phasicity and response to augmentation. Calf Veins: No evidence of thrombus. Normal compressibility and flow on color Doppler imaging. Superficial Great Saphenous Vein: No evidence of thrombus. Normal compressibility and flow on color Doppler imaging. Other Findings:  None. IMPRESSION: Nonocclusive thrombus in the left femoral vein. Electronically Signed   By:  Marcello Moores  Register   On: 03/10/2016 06:56   Dg Chest Portable 1 View  03/08/2016  CLINICAL DATA:  Lower extremity edema. EXAM: PORTABLE CHEST 1 VIEW COMPARISON:  01/12/2009 FINDINGS: The heart size and mediastinal contours are within normal limits. Both lungs are clear. The visualized skeletal structures are unremarkable. IMPRESSION: No active disease. Electronically Signed   By: Kathreen Devoid   On: 03/08/2016 14:54       Today   Subjective:   Paul Mcmahon  Patient feels weak and has pain in his sacral region no other complaint  Objective:   Blood pressure 112/58, pulse 76, temperature 98.5 F (36.9 C), temperature source Oral, resp. rate 18, height 5\' 11"  (1.803 m), weight 72.774 kg (160 lb 7 oz), SpO2 95 %.  .  Intake/Output Summary (Last 24 hours) at 03/15/16 0824 Last data filed at 03/15/16 0507  Gross per 24 hour  Intake   1653 ml  Output   2750 ml  Net  -1097 ml    Exam VITAL SIGNS: Blood pressure 112/58, pulse 76, temperature 98.5 F (36.9 C), temperature source Oral, resp. rate 18, height 5\' 11"  (1.803 m), weight 72.774 kg (160 lb 7 oz), SpO2 95 %.  GENERAL:  82 y.o.-year-old patient lying in the bed with no acute distress.  EYES: Pupils equal, round, reactive to light and accommodation. No scleral icterus. Extraocular muscles intact.  HEENT: Head atraumatic, normocephalic. Oropharynx and nasopharynx clear.  NECK:  Supple, no jugular venous distention. No thyroid enlargement, no tenderness.  LUNGS: Normal breath sounds bilaterally, no wheezing, rales,rhonchi or crepitation. No use of accessory muscles of respiration.  CARDIOVASCULAR: S1, S2 normal. No murmurs, rubs, or gallops.  ABDOMEN: Soft, nontender, nondistended. Bowel sounds present. No organomegaly or mass.  EXTREMITIES: No pedal edema, cyanosis, or clubbing.  NEUROLOGIC: Cranial nerves II through XII are intact. Muscle strength 5/5 in all extremities. Sensation intact. Gait not checked.  PSYCHIATRIC: The patient is  alert and oriented x 3.  SKIN: No obvious  rash, lesion,decubitus ulcer  Data Review     CBC w Diff: Lab Results  Component Value Date   WBC 7.5 03/15/2016   HGB 8.3* 03/15/2016   HCT 24.7* 03/15/2016   PLT 244 03/15/2016   CMP: Lab Results  Component Value Date   NA 130* 03/15/2016   K 4.7 03/15/2016   CL 94* 03/15/2016   CO2 32 03/15/2016   BUN 23* 03/15/2016   CREATININE 0.47* 03/15/2016   PROT 5.6* 03/08/2016   ALBUMIN 2.4* 03/08/2016   BILITOT 1.2 03/08/2016   ALKPHOS 68 03/08/2016   AST 31 03/08/2016   ALT 18 03/08/2016  .  Micro Results Recent Results (from the past 240 hour(s))  Culture, blood (Routine X 2) w Reflex to ID Panel     Status: None   Collection Time: 03/08/16  2:40 PM  Result Value Ref Range Status   Specimen Description BLOOD LEFT WRIST  Final   Special Requests   Final    BOTTLES DRAWN AEROBIC AND ANAEROBIC  AERO 10CC ANA [place]   Culture NO GROWTH 5 DAYS  Final   Report Status 03/13/2016 FINAL  Final  Culture, blood (Routine X 2) w Reflex to ID Panel     Status: None   Collection Time: 03/08/16  2:43 PM  Result Value Ref Range Status   Specimen Description BLOOD LEFT ASSIST CONTROL  Final   Special Requests   Final    BOTTLES DRAWN AEROBIC AND ANAEROBIC  AERO 10CC ANA [place]   Culture NO GROWTH 5 DAYS  Final   Report Status 03/13/2016 FINAL  Final  Anaerobic culture     Status: None   Collection Time: 03/08/16  3:58 PM  Result Value Ref Range Status   Specimen Description BUTTOCKS  Final   Special Requests NONE  Final   Culture   Final    HEAVY GROWTH BACTEROIDES OVATUS BETA LACTAMASE NEGATIVE HEAVY GROWTH PREVOTELLA BIVIA BETA LACTAMASE POSITIVE    Report Status 03/13/2016 FINAL  Final  Wound culture     Status: None   Collection Time: 03/08/16  3:58 PM  Result Value Ref Range Status   Specimen Description BUTTOCKS  Final   Special Requests NONE  Final   Gram Stain   Final    FEW WBC SEEN MANY GRAM POSITIVE COCCI MANY GRAM NEGATIVE  RODS FEW GRAM POSITIVE RODS    Culture   Final    HEAVY GROWTH MORGANELLA MORGANII HEAVY GROWTH PSEUDOMONAS AERUGINOSA HEAVY GROWTH PSEUDOMONAS SPECIES MODERATE GROWTH STAPHYLOCOCCUS AUREUS HEAVY GROWTH ENTEROCOCCUS FAECALIS    Report Status 03/13/2016 FINAL  Final   Organism ID, Bacteria PSEUDOMONAS AERUGINOSA  Final   Organism ID, Bacteria STAPHYLOCOCCUS AUREUS  Final   Organism ID, Bacteria ENTEROCOCCUS FAECALIS  Final   Organism ID, Bacteria MORGANELLA MORGANII  Final   Organism ID, Bacteria PSEUDOMONAS SPECIES  Final      Susceptibility   Staphylococcus aureus - MIC*    CIPROFLOXACIN <=0.5 SENSITIVE Sensitive     ERYTHROMYCIN <=0.25 SENSITIVE Sensitive     GENTAMICIN <=0.5 SENSITIVE Sensitive     OXACILLIN 0.5 SENSITIVE Sensitive     TETRACYCLINE <=1 SENSITIVE Sensitive     VANCOMYCIN 1 SENSITIVE Sensitive     TRIMETH/SULFA <=10 SENSITIVE Sensitive     CLINDAMYCIN <=0.25 SENSITIVE Sensitive     RIFAMPIN <=0.5 SENSITIVE Sensitive     Inducible Clindamycin NEGATIVE Sensitive     * MODERATE GROWTH STAPHYLOCOCCUS AUREUS   Morganella morganii - MIC*  AMPICILLIN/SULBACTAM Value in next row Resistant      RESISTANT>=36    PIP/TAZO Value in next row Sensitive      SENSITIVE<=4    CEFTAZIDIME Value in next row Sensitive      SENSITIVE<=1    CEFTRIAXONE Value in next row Sensitive      SENSITIVE<=1    CEFEPIME Value in next row Sensitive      SENSITIVE<=1    IMIPENEM Value in next row Sensitive      SENSITIVE1    GENTAMICIN Value in next row Sensitive      SENSITIVE<=1    CIPROFLOXACIN Value in next row Sensitive      SENSITIVE<=0.25    TRIMETH/SULFA Value in next row Sensitive      SENSITIVE<=20    * HEAVY GROWTH MORGANELLA MORGANII   Pseudomonas species - MIC*    CEFTAZIDIME Value in next row Sensitive      SENSITIVE4    CIPROFLOXACIN Value in next row Sensitive      SENSITIVE1    GENTAMICIN Value in next row Sensitive      SENSITIVE4    IMIPENEM Value in next  row Sensitive      SENSITIVE<=0.25    PIP/TAZO Value in next row Sensitive      SENSITIVE<=4    * HEAVY GROWTH PSEUDOMONAS SPECIES   Pseudomonas aeruginosa - MIC*    CEFTAZIDIME Value in next row Sensitive      SENSITIVE<=4    CIPROFLOXACIN Value in next row Sensitive      SENSITIVE<=4    GENTAMICIN Value in next row Sensitive      SENSITIVE<=4    IMIPENEM Value in next row Sensitive      SENSITIVE<=4    CEFEPIME Value in next row Sensitive      SENSITIVE<=4    * HEAVY GROWTH PSEUDOMONAS AERUGINOSA   Enterococcus faecalis - MIC*    AMPICILLIN Value in next row Sensitive      SENSITIVE<=4    VANCOMYCIN Value in next row Sensitive      SENSITIVE<=4    GENTAMICIN SYNERGY Value in next row Sensitive      SENSITIVE<=4    LINEZOLID Value in next row Sensitive      SENSITIVE<=4    * HEAVY GROWTH ENTEROCOCCUS FAECALIS        Code Status Orders        Start     Ordered   03/08/16 1556  Full code   Continuous     03/08/16 1557    Code Status History    Date Active Date Inactive Code Status Order ID Comments User Context   This patient has a current code status but no historical code status.    Advance Directive Documentation        Most Recent Value   Type of Advance Directive  Healthcare Power of Attorney, Living will   Pre-existing out of facility DNR order (yellow form or pink MOST form)     "MOST" Form in Place?            Follow-up Information    Follow up with [facility], MD In 3 weeks.   Specialty:  Neurology   Why:  weakness difficutly with walking   Contact information:   [facility] [place] [address] [phone]       Follow up with DAVID Lovie Macadamia, MD In 10 days.   Specialty:  Family Medicine   Contact information:  [address] [place] [address] [phone]       Discharge Medications     Medication List    STOP taking these medications        aspirin EC 81 MG tablet      hydrochlorothiazide 12.5 MG capsule  Commonly known as:  MICROZIDE     metFORMIN 750 MG 24 hr tablet  Commonly known as:  GLUCOPHAGE-XR      TAKE these medications        acetaminophen 325 MG tablet  Commonly known as:  TYLENOL  Take 2 tablets (650 mg total) by mouth every 6 (six) hours as needed for mild pain (or Fever >/= 101).     apixaban 5 MG Tabs tablet  Commonly known as:  ELIQUIS  Take 2 tablets (10 mg total) by mouth 2 (two) times daily.     apixaban 5 MG Tabs tablet  Commonly known as:  ELIQUIS  Take 1 tablet (5 mg total) by mouth 2 (two) times daily.  Start taking on:  03/20/2016     atorvastatin 10 MG tablet  Commonly known as:  LIPITOR  Take 10 mg by mouth at bedtime.     b complex vitamins tablet  Take 1 tablet by mouth daily.     cholecalciferol 1000 units tablet  Commonly known as:  VITAMIN D  Take 1,000 Units by mouth daily.     ciprofloxacin 500 MG tablet  Commonly known as:  CIPRO  Take 1 tablet (500 mg total) by mouth 2 (two) times daily.     digoxin 0.125 MG tablet  Commonly known as:  LANOXIN  Take 0.125 mg by mouth daily.     doxycycline 100 MG capsule  Commonly known as:  VIBRAMYCIN  Take 1 capsule (100 mg total) by mouth 2 (two) times daily.     feeding supplement (ENSURE ENLIVE) Liqd  Take 237 mLs by mouth 3 (three) times daily after meals.     furosemide 20 MG tablet  Commonly known as:  LASIX  Take 20 mg by mouth daily.     insulin aspart 100 UNIT/ML injection  Commonly known as:  novoLOG  Inject 3 Units into the skin 3 (three) times daily with meals.     insulin detemir 100 UNIT/ML injection  Commonly known as:  LEVEMIR  Inject 0.12 mLs (12 Units total) into the skin at bedtime.     magnesium oxide 400 (241.3 Mg) MG tablet  Commonly known as:  MAG-OX  Take 400 mg by mouth 2 (two) times daily.     metoprolol succinate 50 MG 24 hr tablet  Commonly known as:  TOPROL-XL  Take 50 mg by mouth daily. Take with or immediately  following a meal.     metroNIDAZOLE 500 MG tablet  Commonly known as:  FLAGYL  Take 1 tablet (500 mg total) by mouth 3 (three) times daily.     multivitamin with minerals Tabs tablet  Take 1 tablet by mouth daily.     ondansetron 4 MG tablet  Commonly known as:  ZOFRAN  Take 1 tablet (4 mg total) by mouth every 6 (six) hours as needed for nausea.     oxyCODONE 5 MG immediate release tablet  Commonly known as:  Oxy IR/ROXICODONE  Take 1 tablet (5 mg total) by mouth every 4 (four) hours as needed for moderate pain.     SAVAYSA 30 MG Tabs tablet  Generic drug:  edoxaban  Take 30 mg by mouth  daily.     tamsulosin 0.4 MG Caps capsule  Commonly known as:  FLOMAX  Take 1 capsule (0.4 mg total) by mouth daily after supper.           Total Time in preparing paper work, data evaluation and todays exam - 35 minutes  Dustin Flock M.D on 03/15/2016 at 8:24 AM  [facility]   Office  [phone]

## 2016-03-15 NOTE — Progress Notes (Signed)
[facility] discharged [facility], discharge packet prepared by Pollie Meyer and sent via EMS staff. IV was removed with no signs of infection. Dressing clean, dry intact. Sacral wound dressing changed. Clean, dry and intact. No further needs from care management team.

## 2016-03-15 NOTE — Progress Notes (Signed)
Clinical Social Worker was informed that patient will be medically ready to discharge to [facility]. Patient and his family are in a agreement with plan. CSW called Broadus John- Admissions Coordinator at [facility] to confirm that patient's bed is ready. Provided patient's room number [id number] and number to call for report [phone] Yaakov Guthrie) . All discharge information faxed to [facility] via [facility]. Rx's added to discharge packet.   Call to patient's Marden Noble- Patient's son [phone]), left message to inform him patient would discharge to [facility]. RN will call report and patient will discharge to [facility].   Ernest Pine, MSW, [place] Social Work Department [phone]

## 2016-03-15 NOTE — Discharge Instructions (Signed)
°  DIET:  Diabetic diet, 4g sodium diet  DISCHARGE CONDITION:  Fair  ACTIVITY:  Activity as tolerated  OXYGEN:  Home Oxygen: No.   Oxygen Delivery: room air  DISCHARGE LOCATION:  nursing home   Cleanse wound to sacrum and buttocks with NS and pat gently dry.  Apply Dakin's moistened gauze to wound bed for debridement.  Cover with ABD pads.  Change daily and PRN soilage  ADDITIONAL DISCHARGE INSTRUCTION:wound care needs to follow patient at facilyt, pallative care team nurse to follow if avaliable there at facilty  Glucose management  Check cbc in 5 days   If you experience worsening of your admission symptoms, develop shortness of breath, life threatening emergency, suicidal or homicidal thoughts you must seek medical attention immediately by calling 911 or calling your MD immediately  if symptoms less severe.  You Must read complete instructions/literature along with all the possible adverse reactions/side effects for all the Medicines you take and that have been prescribed to you. Take any new Medicines after you have completely understood and accpet all the possible adverse reactions/side effects.   Please note  You were cared for by a hospitalist during your hospital stay. If you have any questions about your discharge medications or the care you received while you were in the hospital after you are discharged, you can call the unit and asked to speak with the hospitalist on call if the hospitalist that took care of you is not available. Once you are discharged, your primary care physician will handle any further medical issues. Please note that NO REFILLS for any discharge medications will be authorized once you are discharged, as it is imperative that you return to your primary care physician (or establish a relationship with a primary care physician if you do not have one) for your aftercare needs so that they can reassess your need for medications and monitor your lab  values.

## 2016-03-15 NOTE — Clinical Social Work Placement (Signed)
   CLINICAL SOCIAL WORK PLACEMENT  NOTE  Date:  03/15/2016  Patient Details  Name: Paul Mcmahon MRN: [id number] Date of Birth: 1925/04/02  Clinical Social Work is seeking post-discharge placement for this patient at the [place] level of care (*CSW will initial, date and re-position this form in  chart as items are completed):  Yes   Patient/family provided with [facility] Work Department's list of facilities offering this level of care within the geographic area requested by the patient (or if unable, by the patient's family).  Yes   Patient/family informed of their freedom to choose among providers that offer the needed level of care, that participate in Medicare, Medicaid or managed care program needed by the patient, have an available bed and are willing to accept the patient.  Yes   Patient/family informed of [facility]'s ownership interest in [facility] and [facility], as well as of the fact that they are under no obligation to receive care at these facilities.  PASRR submitted to EDS on 03/09/16     PASRR number received on 03/09/16     Existing PASRR number confirmed on       FL2 transmitted to all facilities in geographic area requested by pt/family on 03/09/16     FL2 transmitted to all facilities within larger geographic area on       Patient informed that his/her managed care company has contracts with or will negotiate with certain facilities, including the following:        Yes   Patient/family informed of bed offers received.  Patient chooses bed at  ([facility])     Physician recommends and patient chooses bed at      Patient to be transferred to  ([facility]) on 03/15/16.  Patient to be transferred to facility by  [facility])     Patient family notified on 03/15/16 of transfer.  Name of family member notified:   Marden Noble- Son)     PHYSICIAN       Additional Comment:     _______________________________________________ Baldemar Lenis, LCSW 03/15/2016, 8:49 AM

## 2016-05-03 ENCOUNTER — Encounter
Admission: RE | Admit: 2016-05-03 | Discharge: 2016-05-03 | Disposition: A | Discharge status: Home / Self Care | Payer: Medicare Other | Source: Ambulatory Visit | Attending: Internal Medicine | Admitting: Internal Medicine

## 2016-05-04 LAB — GLUCOSE, CAPILLARY: GLUCOSE-CAPILLARY: 133 mg/dL — AB (ref 65–99)

## 2016-05-05 LAB — GLUCOSE, CAPILLARY
GLUCOSE-CAPILLARY: 302 mg/dL — AB (ref 65–99)
GLUCOSE-CAPILLARY: 229 mg/dL — AB (ref 65–99)
GLUCOSE-CAPILLARY: 325 mg/dL — AB (ref 65–99)
Glucose-Capillary: 281 mg/dL — ABNORMAL HIGH (ref 65–99)
Glucose-Capillary: 272 mg/dL — ABNORMAL HIGH (ref 65–99)

## 2016-05-06 LAB — CBC WITH DIFFERENTIAL/PLATELET
BASOS ABS: 0 10*3/uL (ref 0–0.1)
Basophils Relative: 0 %
Eosinophils Absolute: 0.2 10*3/uL (ref 0–0.7)
Eosinophils Relative: 2 %
HEMATOCRIT: 28.9 % — AB (ref 40.0–52.0)
Hemoglobin: 9.4 g/dL — ABNORMAL LOW (ref 13.0–18.0)
LYMPHS PCT: 16 %
Lymphs Abs: 1.6 10*3/uL (ref 1.0–3.6)
MCH: 28.2 pg (ref 26.0–34.0)
MCHC: 32.6 g/dL (ref 32.0–36.0)
MCV: 86.4 fL (ref 80.0–100.0)
Monocytes Absolute: 0.6 10*3/uL (ref 0.2–1.0)
Monocytes Relative: 6 %
NEUTROS ABS: 7.4 10*3/uL — AB (ref 1.4–6.5)
Neutrophils Relative %: 76 %
Platelets: 392 10*3/uL (ref 150–440)
RBC: 3.34 MIL/uL — AB (ref 4.40–5.90)
RDW: 17.4 % — ABNORMAL HIGH (ref 11.5–14.5)
WBC: 9.8 10*3/uL (ref 3.8–10.6)

## 2016-05-06 LAB — COMPREHENSIVE METABOLIC PANEL
ALT: 11 U/L — ABNORMAL LOW (ref 17–63)
ANION GAP: 8 (ref 5–15)
AST: 19 U/L (ref 15–41)
Albumin: 2.4 g/dL — ABNORMAL LOW (ref 3.5–5.0)
Alkaline Phosphatase: 68 U/L (ref 38–126)
BUN: 22 mg/dL — ABNORMAL HIGH (ref 6–20)
CHLORIDE: 94 mmol/L — AB (ref 101–111)
CO2: 29 mmol/L (ref 22–32)
Calcium: 9.8 mg/dL (ref 8.9–10.3)
Creatinine, Ser: 0.37 mg/dL — ABNORMAL LOW (ref 0.61–1.24)
Glucose, Bld: 248 mg/dL — ABNORMAL HIGH (ref 65–99)
POTASSIUM: 4 mmol/L (ref 3.5–5.1)
Sodium: 131 mmol/L — ABNORMAL LOW (ref 135–145)
Total Bilirubin: 0.3 mg/dL (ref 0.3–1.2)
Total Protein: 6.7 g/dL (ref 6.5–8.1)

## 2016-05-06 LAB — MAGNESIUM: MAGNESIUM: 1.6 mg/dL — AB (ref 1.7–2.4)

## 2016-05-06 LAB — GLUCOSE, CAPILLARY
GLUCOSE-CAPILLARY: 334 mg/dL — AB (ref 65–99)
Glucose-Capillary: 195 mg/dL — ABNORMAL HIGH (ref 65–99)
Glucose-Capillary: 170 mg/dL — ABNORMAL HIGH (ref 65–99)
Glucose-Capillary: 257 mg/dL — ABNORMAL HIGH (ref 65–99)
Glucose-Capillary: 353 mg/dL — ABNORMAL HIGH (ref 65–99)

## 2016-05-06 LAB — TSH: TSH: 1.026 u[IU]/mL (ref 0.350–4.500)

## 2016-05-06 LAB — FOLATE: FOLATE: 19.4 ng/mL (ref >5.9)

## 2016-05-06 LAB — VITAMIN B12: VITAMIN B 12: 772 pg/mL (ref 180–914)

## 2016-05-07 LAB — GLUCOSE, CAPILLARY
Glucose-Capillary: 239 mg/dL — ABNORMAL HIGH (ref 65–99)
Glucose-Capillary: 321 mg/dL — ABNORMAL HIGH (ref 65–99)
Glucose-Capillary: 362 mg/dL — ABNORMAL HIGH (ref 65–99)
Glucose-Capillary: 263 mg/dL — ABNORMAL HIGH (ref 65–99)

## 2016-05-07 LAB — HEMOGLOBIN A1C: Hgb A1c MFr Bld: 9.3 % — ABNORMAL HIGH (ref 4.0–6.0)

## 2016-05-07 LAB — VITAMIN D 25 HYDROXY (VIT D DEFICIENCY, FRACTURES): VIT D 25 HYDROXY: 37.6 ng/mL (ref 30.0–100.0)

## 2016-05-08 LAB — GLUCOSE, CAPILLARY
GLUCOSE-CAPILLARY: 191 mg/dL — AB (ref 65–99)
Glucose-Capillary: 273 mg/dL — ABNORMAL HIGH (ref 65–99)
Glucose-Capillary: 298 mg/dL — ABNORMAL HIGH (ref 65–99)
Glucose-Capillary: 302 mg/dL — ABNORMAL HIGH (ref 65–99)

## 2016-05-09 LAB — GLUCOSE, CAPILLARY
GLUCOSE-CAPILLARY: 198 mg/dL — AB (ref 65–99)
GLUCOSE-CAPILLARY: 216 mg/dL — AB (ref 65–99)
GLUCOSE-CAPILLARY: 296 mg/dL — AB (ref 65–99)
GLUCOSE-CAPILLARY: 328 mg/dL — AB (ref 65–99)

## 2016-05-10 ENCOUNTER — Non-Acute Institutional Stay (SKILLED_NURSING_FACILITY): Payer: Medicare Other | Admitting: Gerontology

## 2016-05-10 DIAGNOSIS — L8990 Pressure ulcer of unspecified site, unspecified stage: Secondary | ICD-10-CM

## 2016-05-10 LAB — GLUCOSE, CAPILLARY
GLUCOSE-CAPILLARY: 321 mg/dL — AB (ref 65–99)
GLUCOSE-CAPILLARY: 323 mg/dL — AB (ref 65–99)
Glucose-Capillary: 379 mg/dL — ABNORMAL HIGH (ref 65–99)

## 2016-05-10 NOTE — Progress Notes (Signed)
[facility]  [facility] Service:  SNF [phone]) Provider:  Toni Arthurs, NP-C  Dr Frazier Richards, MD  Patient Care Team: Juluis Pitch, MD as PCP - General [facility])  Extended Emergency Contact Information Primary Emergency Contact: Greene, Viale Address: [address]          [place], [place] [address] Johnnette Litter of [place] Phone: [phone] Relation: None Secondary Emergency Contact: Demetria Pore [place] of [place] Phone: [phone] Relation: Son  Code Status:  Full Goals of care: Advanced Directive information Advanced Directives 03/08/2016  Does patient have an advance directive? No  Type of Advance Directive -  Copy of advanced directive(s) in chart? -  Would patient like information on creating an advanced directive? No - patient declined information     Chief Complaint  Patient presents with  . Wound Check    HPI:  Pt is a 82 y.o. male seen today for an acute visit for wound evaluation. Pt developed multiple decubitous ulcers at another facility. The sacral decub was initially unstageable, covered with eschar. After consistent use of Santyl and damp dressing, the wound has undergone autolytic debridement with clean edges and early granulation with minimal drainage. The left ischial wound is covered with thick exudate and necrotic tissue. Foul odor. Moderate amount thick, tan purulent drainage. Left trochanter with a small stage 2- covered with Allevyn dressing.     Past Medical History  Diagnosis Date  . Diabetes mellitus without complication ([place])   . MI (myocardial infarction) ([place])   . A-fib ([place])   . Cancer [facility])    Past Surgical History  Procedure Laterality Date  . Peripheral vascular catheterization N/A 03/11/2016    Procedure: IVC Filter Insertion;  Surgeon: Algernon Huxley, MD;  Location: [place];  Service: Cardiovascular;  Laterality: N/A;    Allergies  Allergen Reactions  .  Penicillins Other (See Comments)    Reaction: Welts and blisters all over body      Medication List       This list is accurate as of: 05/10/16  8:45 PM.  Always use your most recent med list.               acetaminophen 325 MG tablet  Commonly known as:  TYLENOL  Take 2 tablets (650 mg total) by mouth every 6 (six) hours as needed for mild pain (or Fever >/= 101).     apixaban 5 MG Tabs tablet  Commonly known as:  ELIQUIS  Take 2 tablets (10 mg total) by mouth 2 (two) times daily.     apixaban 5 MG Tabs tablet  Commonly known as:  ELIQUIS  Take 1 tablet (5 mg total) by mouth 2 (two) times daily.     atorvastatin 10 MG tablet  Commonly known as:  LIPITOR  Take 10 mg by mouth at bedtime.     b complex vitamins tablet  Take 1 tablet by mouth daily.     cholecalciferol 1000 units tablet  Commonly known as:  VITAMIN D  Take 1,000 Units by mouth daily.     digoxin 0.125 MG tablet  Commonly known as:  LANOXIN  Take 0.125 mg by mouth daily.     feeding supplement (ENSURE ENLIVE) Liqd  Take 237 mLs by mouth 3 (three) times daily after meals.     furosemide 20 MG tablet  Commonly known as:  LASIX  Take 20 mg by mouth daily.  insulin aspart 100 UNIT/ML injection  Commonly known as:  novoLOG  Inject 3 Units into the skin 3 (three) times daily with meals.     insulin detemir 100 UNIT/ML injection  Commonly known as:  LEVEMIR  Inject 0.12 mLs (12 Units total) into the skin at bedtime.     magnesium oxide 400 (241.3 Mg) MG tablet  Commonly known as:  MAG-OX  Take 400 mg by mouth 2 (two) times daily.     metoprolol succinate 50 MG 24 hr tablet  Commonly known as:  TOPROL-XL  Take 50 mg by mouth daily. Take with or immediately following a meal.     multivitamin with minerals Tabs tablet  Take 1 tablet by mouth daily.     ondansetron 4 MG tablet  Commonly known as:  ZOFRAN  Take 1 tablet (4 mg total) by mouth every 6 (six) hours as needed for nausea.      oxyCODONE 5 MG immediate release tablet  Commonly known as:  Oxy IR/ROXICODONE  Take 1 tablet (5 mg total) by mouth every 4 (four) hours as needed for moderate pain.     SAVAYSA 30 MG Tabs tablet  Generic drug:  edoxaban  Take 30 mg by mouth daily.     tamsulosin 0.4 MG Caps capsule  Commonly known as:  FLOMAX  Take 1 capsule (0.4 mg total) by mouth daily after supper.        Review of Systems  Constitutional: Negative for fever, chills and activity change.  Respiratory: Negative for cough, choking, chest tightness and shortness of breath.   Cardiovascular: Negative for chest pain and leg swelling.  Gastrointestinal: Positive for diarrhea.  Genitourinary: Positive for difficulty urinating.  Musculoskeletal: Negative.   Skin: Positive for pallor and wound.  Neurological: Negative.   Psychiatric/Behavioral: Negative.   All other systems reviewed and are negative.    There is no immunization history on file for this patient. There are no preventive care reminders to display for this patient. No flowsheet data found. Functional Status Survey:    There were no vitals filed for this visit. There is no weight on file to calculate BMI. Physical Exam  Constitutional: He is oriented to person, place, and time. He appears well-developed and well-nourished. No distress.  HENT:  Head: Normocephalic and atraumatic.  Neck: Normal range of motion. No JVD present.  Pulmonary/Chest: Effort normal. No stridor. No respiratory distress.  Abdominal: Soft. He exhibits no distension. There is no tenderness.  Musculoskeletal: Normal range of motion.  Neurological: He is alert and oriented to person, place, and time.  Skin: Skin is warm and dry. He is not diaphoretic. No cyanosis. There is pallor. Nails show no clubbing.     Left Trochanter wound: Stage 2 Sacral Decub: 7.5 x 6.5 x 0.5 cm  Left Ischial decub: 6.5 x 7.5 x 4.5 cm   Moderate amount thick, tan, purulent malodorous drainage;  thick, fiberous exudate/ slough on ischium  Debrided with sharp scalpel, until bleeding. Pt tolerated well.   Psychiatric: He has a normal mood and affect. His behavior is normal. Judgment and thought content normal.  Nursing note and vitals reviewed.   Labs reviewed:  Recent Labs  03/13/16 0438 03/13/16 1732 03/15/16 0424 05/06/16 1400  NA 130*  --  130* 131*  K 3.7  --  4.7 4.0  CL 98*  --  94* 94*  CO2 26  --  32 29  GLUCOSE 198* 419* 291* 248*  BUN 26*  --  23* 22*  CREATININE 0.51*  --  0.47* 0.37*  CALCIUM 8.5*  --  8.7* 9.8  MG  --   --   --  1.6*    Recent Labs  03/08/16 1357 05/06/16 1400  AST 31 19  ALT 18 11*  ALKPHOS 68 68  BILITOT 1.2 0.3  PROT 5.6* 6.7  ALBUMIN 2.4* 2.4*    Recent Labs  03/12/16 0439 03/13/16 0438 03/15/16 0424 05/06/16 1400  WBC 9.4  --  7.5 9.8  NEUTROABS  --   --   --  7.4*  HGB 8.8* 8.9* 8.3* 9.4*  HCT 25.3*  --  24.7* 28.9*  MCV 90.9  --  93.4 86.4  PLT 156  --  244 392   Lab Results  Component Value Date   TSH 1.026 05/06/2016   Lab Results  Component Value Date   HGBA1C 9.3* 05/06/2016   No results found for: CHOL, HDL, LDLCALC, LDLDIRECT, TRIG, CHOLHDL  Significant Diagnostic Results in last 30 days:  No results found.  Assessment/Plan 1. Decubitus ulcer  Sharp instrument debridement of decubitous ulcer  Clean well with NS  Pack with NS damp to dry gauze with thick layer of Santyl ointment  Wound vac to ischial wound, bridge to sacral wound  Flexiseal rectal tube to decrease fecal contamination  Change wound vac dressing m,w,f  Maintain suction at 125 mmhg continuous  Pro stat 30 mL po BID  Glucerna 1 can PO BID for wound healing  Air mattress  Glucose control  Cipro 500 mg po Q 12 hours x 2 weeks for wound infection  Family/ staff Communication:   Total Time: 90 minutes  Documentation: 30 minutes  Face to Face: 60 minutes  Family/Phone:   Labs/tests ordered:  none  Vikki Ports, NP-C [facility] [address], [place] [address] Cell Phone (Mon-Fri 8am-5pm):  [phone] On Call:  [phone] & follow prompts after 5pm & weekends Office Phone:  [phone] Office Fax:  [phone]

## 2016-05-11 LAB — GLUCOSE, CAPILLARY
GLUCOSE-CAPILLARY: 395 mg/dL — AB (ref 65–99)
Glucose-Capillary: 323 mg/dL — ABNORMAL HIGH (ref 65–99)
Glucose-Capillary: 379 mg/dL — ABNORMAL HIGH (ref 65–99)
Glucose-Capillary: 405 mg/dL — ABNORMAL HIGH (ref 65–99)
Glucose-Capillary: 414 mg/dL — ABNORMAL HIGH (ref 65–99)

## 2016-05-12 LAB — GLUCOSE, CAPILLARY
GLUCOSE-CAPILLARY: 328 mg/dL — AB (ref 65–99)
GLUCOSE-CAPILLARY: 338 mg/dL — AB (ref 65–99)
Glucose-Capillary: 327 mg/dL — ABNORMAL HIGH (ref 65–99)
Glucose-Capillary: 252 mg/dL — ABNORMAL HIGH (ref 65–99)

## 2016-05-13 LAB — GLUCOSE, CAPILLARY
GLUCOSE-CAPILLARY: 217 mg/dL — AB (ref 65–99)
GLUCOSE-CAPILLARY: 151 mg/dL — AB (ref 65–99)
Glucose-Capillary: 135 mg/dL — ABNORMAL HIGH (ref 65–99)

## 2016-05-14 ENCOUNTER — Non-Acute Institutional Stay (SKILLED_NURSING_FACILITY): Payer: Medicare Other | Admitting: Gerontology

## 2016-05-14 DIAGNOSIS — E871 Hypo-osmolality and hyponatremia: Secondary | ICD-10-CM

## 2016-05-14 DIAGNOSIS — L8990 Pressure ulcer of unspecified site, unspecified stage: Secondary | ICD-10-CM

## 2016-05-14 LAB — CBC WITH DIFFERENTIAL/PLATELET
BASOS PCT: 0 %
Basophils Absolute: 0 10*3/uL (ref 0–0.1)
Eosinophils Absolute: 0 10*3/uL (ref 0–0.7)
Eosinophils Relative: 0 %
HEMATOCRIT: 23.6 % — AB (ref 40.0–52.0)
HEMOGLOBIN: 7.8 g/dL — AB (ref 13.0–18.0)
LYMPHS ABS: 0.7 10*3/uL — AB (ref 1.0–3.6)
LYMPHS PCT: 4 %
MCH: 27.5 pg (ref 26.0–34.0)
MCHC: 33 g/dL (ref 32.0–36.0)
MCV: 83.2 fL (ref 80.0–100.0)
MONO ABS: 1.1 10*3/uL — AB (ref 0.2–1.0)
MONOS PCT: 5 %
NEUTROS ABS: 18.8 10*3/uL — AB (ref 1.4–6.5)
NEUTROS PCT: 91 %
Platelets: 285 10*3/uL (ref 150–440)
RBC: 2.84 MIL/uL — ABNORMAL LOW (ref 4.40–5.90)
RDW: 17.9 % — ABNORMAL HIGH (ref 11.5–14.5)
WBC: 20.7 10*3/uL — ABNORMAL HIGH (ref 3.8–10.6)

## 2016-05-14 LAB — GLUCOSE, CAPILLARY
GLUCOSE-CAPILLARY: 160 mg/dL — AB (ref 65–99)
GLUCOSE-CAPILLARY: 244 mg/dL — AB (ref 65–99)
GLUCOSE-CAPILLARY: 262 mg/dL — AB (ref 65–99)
GLUCOSE-CAPILLARY: 298 mg/dL — AB (ref 65–99)
Glucose-Capillary: 302 mg/dL — ABNORMAL HIGH (ref 65–99)

## 2016-05-14 LAB — COMPREHENSIVE METABOLIC PANEL
ALBUMIN: 1.8 g/dL — AB (ref 3.5–5.0)
ALT: 23 U/L (ref 17–63)
ANION GAP: 9 (ref 5–15)
AST: 45 U/L — ABNORMAL HIGH (ref 15–41)
Alkaline Phosphatase: 124 U/L (ref 38–126)
BUN: 41 mg/dL — ABNORMAL HIGH (ref 6–20)
CALCIUM: 9.1 mg/dL (ref 8.9–10.3)
CHLORIDE: 88 mmol/L — AB (ref 101–111)
CO2: 25 mmol/L (ref 22–32)
Creatinine, Ser: 0.64 mg/dL (ref 0.61–1.24)
GFR calc non Af Amer: >60 mL/min (ref >60)
GLUCOSE: 265 mg/dL — AB (ref 65–99)
POTASSIUM: 4.4 mmol/L (ref 3.5–5.1)
SODIUM: 122 mmol/L — AB (ref 135–145)
Total Bilirubin: 0.7 mg/dL (ref 0.3–1.2)
Total Protein: 5.6 g/dL — ABNORMAL LOW (ref 6.5–8.1)

## 2016-05-14 LAB — SEDIMENTATION RATE: SED RATE: 108 mm/h — AB (ref 0–20)

## 2016-05-14 LAB — MAGNESIUM: Magnesium: 1.9 mg/dL (ref 1.7–2.4)

## 2016-05-14 LAB — C-REACTIVE PROTEIN: CRP: 19.4 mg/dL — AB (ref <1.0)

## 2016-05-14 NOTE — Progress Notes (Signed)
[facility]  [facility]:  SNF [phone]) Provider:  Toni Arthurs, NP-C  DAVID Lovie Macadamia, MD  Patient Care Team: Juluis Pitch, MD as PCP - General [facility])  Extended Emergency Contact Information Primary Emergency Contact: Coleman, Kalas Address: [address], [place] [address] Johnnette Litter of [place] Phone: [phone] Relation: None Secondary Emergency Contact: Demetria Pore [place] of [place] Phone: [phone] Relation: Son  Code Status:  Full Goals of care: Advanced Directive information Advanced Directives 03/08/2016  Does patient have an advance directive? No  Type of Advance Directive -  Copy of advanced directive(s) in chart? -  Would patient like information on creating an advanced directive? No - patient declined information     Chief Complaint  Patient presents with  . Wound Check    HPI:  Pt is a 82 y.o. male seen today for an acute visit for wound care. Pt has been incontinent and the wound has been contaminated with stool. Wounds have further necrosis and more pus pockets that have formed in only 2 days. The ischial wound has 6 mm tunneling at 1:00. The sacral wound has worsening necrosis at 3:00 with area in the center that is now likely to the bone. Purulent, malodorous drainage. Labs obtained. Pt now with significant hyponatremia and leukocytosis. No fever.     Past Medical History  Diagnosis Date  . Diabetes mellitus without complication ([place])   . MI (myocardial infarction) ([place])   . A-fib ([place])   . Cancer [facility])    Past Surgical History  Procedure Laterality Date  . Peripheral vascular catheterization N/A 03/11/2016    Procedure: IVC Filter Insertion;  Surgeon: Algernon Huxley, MD;  Location: [facility] CV LAB;  Service: Cardiovascular;  Laterality: N/A;    Allergies  Allergen Reactions  . Penicillins Other (See Comments)    Reaction: Welts and blisters all over body        Medication List       This list is accurate as of: 05/14/16  2:58 PM.  Always use your most recent med list.               acetaminophen 325 MG tablet  Commonly known as:  TYLENOL  Take 2 tablets (650 mg total) by mouth every 6 (six) hours as needed for mild pain (or Fever >/= 101).     apixaban 5 MG Tabs tablet  Commonly known as:  ELIQUIS  Take 2 tablets (10 mg total) by mouth 2 (two) times daily.     apixaban 5 MG Tabs tablet  Commonly known as:  ELIQUIS  Take 1 tablet (5 mg total) by mouth 2 (two) times daily.     atorvastatin 10 MG tablet  Commonly known as:  LIPITOR  Take 10 mg by mouth at bedtime.     b complex vitamins tablet  Take 1 tablet by mouth daily.     cholecalciferol 1000 units tablet  Commonly known as:  VITAMIN D  Take 1,000 Units by mouth daily.     digoxin 0.125 MG tablet  Commonly known as:  LANOXIN  Take 0.125 mg by mouth daily.     feeding supplement (ENSURE ENLIVE) Liqd  Take 237 mLs by mouth 3 (three) times daily after meals.     furosemide 20 MG tablet  Commonly known as:  LASIX  Take 20 mg by mouth daily.  insulin aspart 100 UNIT/ML injection  Commonly known as:  novoLOG  Inject 3 Units into the skin 3 (three) times daily with meals.     insulin detemir 100 UNIT/ML injection  Commonly known as:  LEVEMIR  Inject 0.12 mLs (12 Units total) into the skin at bedtime.     magnesium oxide 400 (241.3 Mg) MG tablet  Commonly known as:  MAG-OX  Take 400 mg by mouth 2 (two) times daily.     metoprolol succinate 50 MG 24 hr tablet  Commonly known as:  TOPROL-XL  Take 50 mg by mouth daily. Take with or immediately following a meal.     multivitamin with minerals Tabs tablet  Take 1 tablet by mouth daily.     ondansetron 4 MG tablet  Commonly known as:  ZOFRAN  Take 1 tablet (4 mg total) by mouth every 6 (six) hours as needed for nausea.     oxyCODONE 5 MG immediate release tablet  Commonly known as:  Oxy IR/ROXICODONE  Take 1  tablet (5 mg total) by mouth every 4 (four) hours as needed for moderate pain.     SAVAYSA 30 MG Tabs tablet  Generic drug:  edoxaban  Take 30 mg by mouth daily.     tamsulosin 0.4 MG Caps capsule  Commonly known as:  FLOMAX  Take 1 capsule (0.4 mg total) by mouth daily after supper.        Review of Systems  Constitutional: Negative.   HENT: Negative.   Respiratory: Negative for cough, choking, chest tightness and shortness of breath.   Cardiovascular: Negative for chest pain and leg swelling.  Gastrointestinal: Positive for diarrhea.  Genitourinary: Negative.   Musculoskeletal: Negative.   Skin: Positive for pallor and wound.  Neurological: Negative.   Psychiatric/Behavioral: Negative.   All other systems reviewed and are negative.    There is no immunization history on file for this patient. Pertinent  Health Maintenance Due  Topic Date Due  . FOOT EXAM  05/11/1935  . OPHTHALMOLOGY EXAM  05/11/1935  . URINE MICROALBUMIN  05/11/1935  . PNA vac Low Risk Adult (1 of 2 - PCV13) 05/11/1992  . INFLUENZA VACCINE  05/24/2016  . HEMOGLOBIN A1C  11/06/2016   No flowsheet data found. Functional Status Survey:    Filed Vitals:   05/14/16 1431  BP: 131/63  Pulse: 99  Temp: 97.8 F (36.6 C)  Resp: 16  SpO2: 99%   There is no weight on file to calculate BMI. Physical Exam  Constitutional: He is oriented to person, place, and time.  Pulmonary/Chest: No respiratory distress.  Abdominal: Soft. He exhibits no distension. There is no tenderness.  Flexiseal in place  Genitourinary:  Foley in place  Musculoskeletal: Normal range of motion.  Neurological: He is alert and oriented to person, place, and time.  Skin: Skin is warm and dry. He is not diaphoretic. No cyanosis. There is pallor. Nails show no clubbing.     Psychiatric: He has a normal mood and affect. His speech is normal and behavior is normal. Judgment normal. Cognition and memory are normal.  Nursing note and  vitals reviewed.   Labs reviewed:  Recent Labs  03/15/16 0424 05/06/16 1400 05/14/16 0500  NA 130* 131* 122*  K 4.7 4.0 4.4  CL 94* 94* 88*  CO2 32 29 25  GLUCOSE 291* 248* 265*  BUN 23* 22* 41*  CREATININE 0.47* 0.37* 0.64  CALCIUM 8.7* 9.8 9.1  MG  --  1.6* 1.9  Recent Labs  03/08/16 1357 05/06/16 1400 05/14/16 0500  AST 31 19 45*  ALT 18 11* 23  ALKPHOS 68 68 124  BILITOT 1.2 0.3 0.7  PROT 5.6* 6.7 5.6*  ALBUMIN 2.4* 2.4* 1.8*    Recent Labs  03/15/16 0424 05/06/16 1400 05/14/16 0500  WBC 7.5 9.8 20.7*  NEUTROABS  --  7.4* 18.8*  HGB 8.3* 9.4* 7.8*  HCT 24.7* 28.9* 23.6*  MCV 93.4 86.4 83.2  PLT 244 392 285   Lab Results  Component Value Date   TSH 1.026 05/06/2016   Lab Results  Component Value Date   HGBA1C 9.3* 05/06/2016   No results found for: CHOL, HDL, LDLCALC, LDLDIRECT, TRIG, CHOLHDL  Significant Diagnostic Results in last 30 days:  No results found.  Assessment/Plan 1. Decubitus ulcer  Sharp instrument debridement (repeat) of ischial and sacral wounds  Pack wounds with 4 x 4 gauze with Dakin's solution, cover with dry 4x4. Change BID  Wound vac application on Monday if wounds clean  Labs, Xrays  Continue air mattress  DC po Cipro  Levaquin 750 mg IV Q Day  Vancomycin 1 gram IV BID  Peripheral Iv for now  Vanc trough Sunday pm  Flexi-seal rectal tube for stool containment/ decrease contamination  Foley catheter for urine containment/ decrease contamination= incontinent  2. Hyponatremia  0.9% NS- 250 mL bolus over 1 hour, then 100 mL/ hr x 72 hours  Encourage PO intake of fluids other than water  1 liter free water restriction  Gatorade BID and prn  Family/ staff Communication:   Total Time: 90 minutes  Documentation: 20 minutes  Face to Face: 70 minutes  Family/Phone: discussed/ updated nursing, nursing notified family   Labs/tests ordered: CBC, Met C, CRP, ESR, Wound culture, Pelvic/ sacrum xrays  to r/o osteomyelitis  Vikki Ports, NP-C [facility] [address], [place] [address] Cell Phone (Mon-Fri 8am-5pm):  [phone] On Call:  [phone] & follow prompts after 5pm & weekends Office Phone:  [phone] Office Fax:  [phone]

## 2016-05-15 LAB — GLUCOSE, CAPILLARY
GLUCOSE-CAPILLARY: 233 mg/dL — AB (ref 65–99)
Glucose-Capillary: 290 mg/dL — ABNORMAL HIGH (ref 65–99)

## 2016-05-16 LAB — BASIC METABOLIC PANEL
Anion gap: 6 (ref 5–15)
BUN: 34 mg/dL — AB (ref 6–20)
CO2: 25 mmol/L (ref 22–32)
CREATININE: 0.39 mg/dL — AB (ref 0.61–1.24)
Calcium: 8.9 mg/dL (ref 8.9–10.3)
Chloride: 93 mmol/L — ABNORMAL LOW (ref 101–111)
GFR calc Af Amer: >60 mL/min (ref >60)
GLUCOSE: 273 mg/dL — AB (ref 65–99)
Potassium: 4.5 mmol/L (ref 3.5–5.1)
SODIUM: 124 mmol/L — AB (ref 135–145)

## 2016-05-16 LAB — CBC WITH DIFFERENTIAL/PLATELET
Basophils Absolute: 0 10*3/uL (ref 0–0.1)
Basophils Relative: 0 %
EOS ABS: 0.1 10*3/uL (ref 0–0.7)
EOS PCT: 1 %
HCT: 25.7 % — ABNORMAL LOW (ref 40.0–52.0)
Hemoglobin: 8.3 g/dL — ABNORMAL LOW (ref 13.0–18.0)
LYMPHS ABS: 0.8 10*3/uL — AB (ref 1.0–3.6)
Lymphocytes Relative: 7 %
MCH: 27.1 pg (ref 26.0–34.0)
MCHC: 32.5 g/dL (ref 32.0–36.0)
MCV: 83.3 fL (ref 80.0–100.0)
MONO ABS: 0.7 10*3/uL (ref 0.2–1.0)
Monocytes Relative: 6 %
Neutro Abs: 10.2 10*3/uL — ABNORMAL HIGH (ref 1.4–6.5)
Neutrophils Relative %: 86 %
PLATELETS: 267 10*3/uL (ref 150–440)
RBC: 3.08 MIL/uL — AB (ref 4.40–5.90)
RDW: 17.8 % — AB (ref 11.5–14.5)
WBC: 11.7 10*3/uL — AB (ref 3.8–10.6)

## 2016-05-16 LAB — GLUCOSE, CAPILLARY
GLUCOSE-CAPILLARY: 245 mg/dL — AB (ref 65–99)
Glucose-Capillary: 214 mg/dL — ABNORMAL HIGH (ref 65–99)
Glucose-Capillary: 179 mg/dL — ABNORMAL HIGH (ref 65–99)
Glucose-Capillary: 162 mg/dL — ABNORMAL HIGH (ref 65–99)

## 2016-05-16 LAB — VANCOMYCIN, TROUGH: VANCOMYCIN TR: 10 ug/mL — AB (ref 15–20)

## 2016-05-17 LAB — AEROBIC CULTURE W GRAM STAIN (SUPERFICIAL SPECIMEN)

## 2016-05-17 LAB — GLUCOSE, CAPILLARY
GLUCOSE-CAPILLARY: 98 mg/dL (ref 65–99)
GLUCOSE-CAPILLARY: 111 mg/dL — AB (ref 65–99)
GLUCOSE-CAPILLARY: 242 mg/dL — AB (ref 65–99)
Glucose-Capillary: 132 mg/dL — ABNORMAL HIGH (ref 65–99)

## 2016-05-18 LAB — GLUCOSE, CAPILLARY
GLUCOSE-CAPILLARY: 264 mg/dL — AB (ref 65–99)
Glucose-Capillary: 148 mg/dL — ABNORMAL HIGH (ref 65–99)
Glucose-Capillary: 211 mg/dL — ABNORMAL HIGH (ref 65–99)

## 2016-05-19 LAB — GLUCOSE, CAPILLARY
GLUCOSE-CAPILLARY: 188 mg/dL — AB (ref 65–99)
GLUCOSE-CAPILLARY: 305 mg/dL — AB (ref 65–99)

## 2016-05-20 ENCOUNTER — Non-Acute Institutional Stay (SKILLED_NURSING_FACILITY): Payer: Medicare Other | Admitting: Gerontology

## 2016-05-20 DIAGNOSIS — Z7189 Other specified counseling: Secondary | ICD-10-CM | POA: Diagnosis not present

## 2016-05-20 DIAGNOSIS — E43 Unspecified severe protein-calorie malnutrition: Secondary | ICD-10-CM

## 2016-05-20 DIAGNOSIS — R6251 Failure to thrive (child): Secondary | ICD-10-CM

## 2016-05-20 DIAGNOSIS — L8990 Pressure ulcer of unspecified site, unspecified stage: Secondary | ICD-10-CM | POA: Diagnosis not present

## 2016-05-20 DIAGNOSIS — R627 Adult failure to thrive: Secondary | ICD-10-CM | POA: Diagnosis not present

## 2016-05-20 LAB — GLUCOSE, CAPILLARY
GLUCOSE-CAPILLARY: 207 mg/dL — AB (ref 65–99)
GLUCOSE-CAPILLARY: 249 mg/dL — AB (ref 65–99)
Glucose-Capillary: 209 mg/dL — ABNORMAL HIGH (ref 65–99)
Glucose-Capillary: 296 mg/dL — ABNORMAL HIGH (ref 65–99)

## 2016-05-20 LAB — VANCOMYCIN, TROUGH: Vancomycin Tr: 11 ug/mL — ABNORMAL LOW (ref 15–20)

## 2016-05-20 LAB — BASIC METABOLIC PANEL
ANION GAP: 8 (ref 5–15)
BUN: 28 mg/dL — AB (ref 6–20)
CHLORIDE: 94 mmol/L — AB (ref 101–111)
CO2: 24 mmol/L (ref 22–32)
Calcium: 9 mg/dL (ref 8.9–10.3)
Creatinine, Ser: 0.43 mg/dL — ABNORMAL LOW (ref 0.61–1.24)
GFR calc Af Amer: >60 mL/min (ref >60)
GLUCOSE: 182 mg/dL — AB (ref 65–99)
POTASSIUM: 3.9 mmol/L (ref 3.5–5.1)
Sodium: 126 mmol/L — ABNORMAL LOW (ref 135–145)

## 2016-05-20 LAB — CBC WITH DIFFERENTIAL/PLATELET
BASOS ABS: 0.1 10*3/uL (ref 0–0.1)
Basophils Relative: 1 %
Eosinophils Absolute: 0.2 10*3/uL (ref 0–0.7)
Eosinophils Relative: 1 %
HEMATOCRIT: 27.9 % — AB (ref 40.0–52.0)
HEMOGLOBIN: 9.1 g/dL — AB (ref 13.0–18.0)
LYMPHS ABS: 1.1 10*3/uL (ref 1.0–3.6)
LYMPHS PCT: 8 %
MCH: 27 pg (ref 26.0–34.0)
MCHC: 32.4 g/dL (ref 32.0–36.0)
MCV: 83.1 fL (ref 80.0–100.0)
Monocytes Absolute: 0.6 10*3/uL (ref 0.2–1.0)
Monocytes Relative: 4 %
NEUTROS ABS: 12.8 10*3/uL — AB (ref 1.4–6.5)
NEUTROS PCT: 86 %
Platelets: 352 10*3/uL (ref 150–440)
RBC: 3.36 MIL/uL — AB (ref 4.40–5.90)
RDW: 18.2 % — ABNORMAL HIGH (ref 11.5–14.5)
WBC: 14.7 10*3/uL — AB (ref 3.8–10.6)

## 2016-05-20 LAB — CREATININE, SERUM: CREATININE: 0.4 mg/dL — AB (ref 0.61–1.24)

## 2016-05-20 LAB — BUN: BUN: 28 mg/dL — ABNORMAL HIGH (ref 6–20)

## 2016-05-21 LAB — GLUCOSE, CAPILLARY
GLUCOSE-CAPILLARY: 222 mg/dL — AB (ref 65–99)
GLUCOSE-CAPILLARY: 282 mg/dL — AB (ref 65–99)
GLUCOSE-CAPILLARY: 297 mg/dL — AB (ref 65–99)
Glucose-Capillary: 293 mg/dL — ABNORMAL HIGH (ref 65–99)

## 2016-05-22 LAB — GLUCOSE, CAPILLARY
GLUCOSE-CAPILLARY: 122 mg/dL — AB (ref 65–99)
GLUCOSE-CAPILLARY: 165 mg/dL — AB (ref 65–99)
Glucose-Capillary: 193 mg/dL — ABNORMAL HIGH (ref 65–99)
Glucose-Capillary: 169 mg/dL — ABNORMAL HIGH (ref 65–99)

## 2016-05-22 NOTE — Progress Notes (Signed)
Interdisciplinary Goals of Care Family Meeting    Date carried out:: 05/20/2016  Location of the meeting: Room of Paul Mcmahon, [place] involved: Toni Arthurs, Nurse Practitioner; Eustace Moore, [place]; Lilyan Punt, OT; Etta Grandchild, SW; Mosetta Pigeon, business office; Leroy Kennedy III, son; Vraj Tease, son; [name], [place], patient  Durable Power of Attorney or acting medical decision maker: Ace Gins   Discussion: We discussed goals of care for Paul Mcmahon . This discussion included description of wound, the wound's fast progression, wounds characteristics: depth, size, necrosis, odor, infection, location. Pt's poor nutritional status, likelihood of wound not healing, likelihood of sepsis, osteomyelitis, MRSA, Discussed stopping IV antibiotics and changing to PO; wound vac vs no wound vac; Hospice services vs staying on skilled days and see what happens; pain control; nutritional needs; co-morbidities.   Code status: DNR/ DNI  Disposition: Pt will remain in facility, Do Not Hospitalize; Continue IV antibiotics; Fentanyl patch 12 mcg- change Q 3 days for pain control, plus breakthrough prn oxycodone; application of woundvac or appropriate dressings; Palliative Care to follow-up with patient and sons periodically.  Time spent for the meeting:  60 minutes   Vikki Ports, NP-C [facility] [phone], [place] [address] Cell Phone (Mon-Fri 8am-5pm):  [phone] On Call:  [phone] & follow prompts after 5pm & weekends Office Phone:  [phone] Office Fax:  [phone]

## 2016-05-23 LAB — GLUCOSE, CAPILLARY
Glucose-Capillary: 136 mg/dL — ABNORMAL HIGH (ref 65–99)
Glucose-Capillary: 129 mg/dL — ABNORMAL HIGH (ref 65–99)

## 2016-05-23 LAB — VANCOMYCIN, TROUGH: VANCOMYCIN TR: 18 ug/mL (ref 15–20)

## 2016-05-23 LAB — CREATININE, SERUM: Creatinine, Ser: <0.3 mg/dL — ABNORMAL LOW (ref 0.61–1.24)

## 2016-05-23 LAB — BUN: BUN: 16 mg/dL (ref 6–20)

## 2016-05-24 ENCOUNTER — Encounter
Admission: RE | Admit: 2016-05-24 | Discharge: 2016-05-24 | Disposition: A | Discharge status: Home / Self Care | Payer: Medicare Other | Source: Ambulatory Visit | Attending: Internal Medicine | Admitting: Internal Medicine

## 2016-05-24 ENCOUNTER — Other Ambulatory Visit
Admission: RE | Admit: 2016-05-24 | Discharge: 2016-05-24 | Disposition: A | Discharge status: Home / Self Care | Payer: Medicare Other | Source: Ambulatory Visit | Attending: Gerontology | Admitting: Gerontology

## 2016-05-24 DIAGNOSIS — L89324 Pressure ulcer of left buttock, stage 4: Secondary | ICD-10-CM | POA: Insufficient documentation

## 2016-05-24 LAB — CREATININE, SERUM
CREATININE: 0.31 mg/dL — AB (ref 0.61–1.24)
GFR calc non Af Amer: >60 mL/min (ref >60)

## 2016-05-24 LAB — BUN: BUN: 17 mg/dL (ref 6–20)

## 2016-05-24 LAB — VANCOMYCIN, TROUGH: Vancomycin Tr: 20 ug/mL (ref 15–20)

## 2016-05-24 LAB — GLUCOSE, CAPILLARY
GLUCOSE-CAPILLARY: 67 mg/dL (ref 65–99)
Glucose-Capillary: 116 mg/dL — ABNORMAL HIGH (ref 65–99)
Glucose-Capillary: 261 mg/dL — ABNORMAL HIGH (ref 65–99)

## 2016-05-25 ENCOUNTER — Non-Acute Institutional Stay (SKILLED_NURSING_FACILITY): Payer: Medicare Other | Admitting: Gerontology

## 2016-05-25 DIAGNOSIS — R627 Adult failure to thrive: Secondary | ICD-10-CM

## 2016-05-25 DIAGNOSIS — L8990 Pressure ulcer of unspecified site, unspecified stage: Secondary | ICD-10-CM | POA: Diagnosis not present

## 2016-05-25 DIAGNOSIS — R6251 Failure to thrive (child): Secondary | ICD-10-CM

## 2016-05-26 LAB — BASIC METABOLIC PANEL
ANION GAP: 7 (ref 5–15)
BUN: 26 mg/dL — ABNORMAL HIGH (ref 6–20)
CHLORIDE: 97 mmol/L — AB (ref 101–111)
CO2: 27 mmol/L (ref 22–32)
CREATININE: 0.46 mg/dL — AB (ref 0.61–1.24)
Calcium: 9.2 mg/dL (ref 8.9–10.3)
GFR calc non Af Amer: >60 mL/min (ref >60)
Glucose, Bld: 58 mg/dL — ABNORMAL LOW (ref 65–99)
Potassium: 4.5 mmol/L (ref 3.5–5.1)
SODIUM: 131 mmol/L — AB (ref 135–145)

## 2016-05-26 LAB — GLUCOSE, CAPILLARY
GLUCOSE-CAPILLARY: 108 mg/dL — AB (ref 65–99)
Glucose-Capillary: 127 mg/dL — ABNORMAL HIGH (ref 65–99)
Glucose-Capillary: 113 mg/dL — ABNORMAL HIGH (ref 65–99)

## 2016-05-26 LAB — VANCOMYCIN, TROUGH: Vancomycin Tr: 18 ug/mL (ref 15–20)

## 2016-05-26 NOTE — Progress Notes (Signed)
Location:      Place of Service:  SNF (31) Provider:  Toni Arthurs, NP-C  DAVID Lovie Macadamia, MD  Patient Care Team: Juluis Pitch, MD as PCP - General (Family Medicine)  Extended Emergency Contact Information Primary Emergency Contact: Fredric Dine Address: [address], [place] [address] Johnnette Litter of [place] Phone: [phone] Relation: None Secondary Emergency Contact: Demetria Pore [place] of [place] Phone: [phone] Relation: Son  Code Status:  DNR Goals of care: Advanced Directive information Advanced Directives 03/08/2016  Does patient have an advance directive? No  Type of Advance Directive -  Copy of advanced directive(s) in chart? -  Would patient like information on creating an advanced directive? No - patient declined information     Chief Complaint  Patient presents with  . Wound Check  . Medical Management of Chronic Issues    HPI:  Pt is a 82 y.o. male seen today for an acute visit for wound check and management of chronic medical issues, particularly failure to thrive and decubitus ulcer. Pt has had rapidly progressing decub wound on the ischium. Despite treatment with Santyl ointment and damp to dry dressing dressing to the sacrum and ischium. Wound is progressing with worsening tunneling and undermining. Foul, purulent drainage in pockets. Necrotic tissue that has had sharp debridement twice. He continues to have poor nutritional status. He eats most of his meals, but if his wife (with dementia) is in the room, he will give his tray to her to eat. He is on ProStat and Ensure with mirtazepine at HS for appetite stimulation. He is on IV vancomycin, pharmacy dosed, as well as IV Levaquin treating under the assumption of MRSA and Osteomyelitis. Attempted to obtain wound culture, but unable to obtain clean sample. Palliative Care consult has been initiated with discussions with family and patient about pros/cons of surgical  intervention, Hospice Care, Pain management, etc. Sons have decided to be more aggressive with wound care with the hopes of resolution of the wounds.       Past Medical History:  Diagnosis Date  . A-fib ([place])   . Cancer ([place])   . Diabetes mellitus without complication ([place])   . MI (myocardial infarction) [facility])    Past Surgical History:  Procedure Laterality Date  . PERIPHERAL VASCULAR CATHETERIZATION N/A 03/11/2016   Procedure: IVC Filter Insertion;  Surgeon: Algernon Huxley, MD;  Location: [place] [facility];  Service: Cardiovascular;  Laterality: N/A;    Allergies  Allergen Reactions  . Penicillins Other (See Comments)    Reaction: Welts and blisters all over body      Medication List       Accurate as of 05/25/16 11:59 PM. Always use your most recent med list.          acetaminophen 325 MG tablet Commonly known as:  TYLENOL Take 2 tablets (650 mg total) by mouth every 6 (six) hours as needed for mild pain (or Fever >/= 101).   apixaban 5 MG Tabs tablet Commonly known as:  ELIQUIS Take 2 tablets (10 mg total) by mouth 2 (two) times daily.   apixaban 5 MG Tabs tablet Commonly known as:  ELIQUIS Take 1 tablet (5 mg total) by mouth 2 (two) times daily.   atorvastatin 10 MG tablet Commonly known as:  LIPITOR Take 10 mg by mouth at bedtime.   b complex vitamins tablet Take 1 tablet by mouth daily.   cholecalciferol 1000 units  tablet Commonly known as:  VITAMIN D Take 1,000 Units by mouth daily.   digoxin 0.125 MG tablet Commonly known as:  LANOXIN Take 0.125 mg by mouth daily.   feeding supplement (ENSURE ENLIVE) Liqd Take 237 mLs by mouth 3 (three) times daily after meals.   furosemide 20 MG tablet Commonly known as:  LASIX Take 20 mg by mouth daily.   insulin aspart 100 UNIT/ML injection Commonly known as:  novoLOG Inject 3 Units into the skin 3 (three) times daily with meals.   insulin detemir 100 UNIT/ML injection Commonly known as:  LEVEMIR Inject  0.12 mLs (12 Units total) into the skin at bedtime.   magnesium oxide 400 (241.3 Mg) MG tablet Commonly known as:  MAG-OX Take 400 mg by mouth 2 (two) times daily.   metoprolol succinate 50 MG 24 hr tablet Commonly known as:  TOPROL-XL Take 50 mg by mouth daily. Take with or immediately following a meal.   multivitamin with minerals Tabs tablet Take 1 tablet by mouth daily.   ondansetron 4 MG tablet Commonly known as:  ZOFRAN Take 1 tablet (4 mg total) by mouth every 6 (six) hours as needed for nausea.   oxyCODONE 5 MG immediate release tablet Commonly known as:  Oxy IR/ROXICODONE Take 1 tablet (5 mg total) by mouth every 4 (four) hours as needed for moderate pain.   SAVAYSA 30 MG Tabs tablet Generic drug:  edoxaban Take 30 mg by mouth daily.   tamsulosin 0.4 MG Caps capsule Commonly known as:  FLOMAX Take 1 capsule (0.4 mg total) by mouth daily after supper.       Review of Systems  Constitutional: Positive for appetite change and fatigue. Negative for fever.  HENT: Negative for congestion, sneezing, sore throat, trouble swallowing and voice change.   Respiratory: Negative for apnea, cough, choking, chest tightness, shortness of breath and wheezing.   Cardiovascular: Negative for chest pain, palpitations and leg swelling.  Gastrointestinal: Positive for diarrhea (incontinent of soft stools). Negative for abdominal distention, abdominal pain, constipation and nausea.  Genitourinary: Positive for difficulty urinating (incontinent- foley in place).  Musculoskeletal: Positive for arthralgias (typical arthritis), back pain, gait problem and myalgias.  Skin: Negative for color change, pallor, rash and wound.  Neurological: Negative for dizziness, tremors, syncope, speech difficulty, weakness, numbness and headaches.  Psychiatric/Behavioral: Negative for agitation and behavioral problems.  All other systems reviewed and are negative.    There is no immunization history on  file for this patient. Pertinent  Health Maintenance Due  Topic Date Due  . FOOT EXAM  05/11/1935  . OPHTHALMOLOGY EXAM  05/11/1935  . URINE MICROALBUMIN  05/11/1935  . PNA vac Low Risk Adult (1 of 2 - PCV13) 05/11/1992  . INFLUENZA VACCINE  05/24/2016  . HEMOGLOBIN A1C  11/06/2016   No flowsheet data found. Functional Status Survey:    Vitals:   05/25/16 1805  BP: (!) 149/75  Pulse: 74  Resp: 20  Temp: 97.5 F (36.4 C)  SpO2: 97%   There is no height or weight on file to calculate BMI. Physical Exam  Constitutional: He is oriented to person, place, and time. Vital signs are normal. He appears well-developed and well-nourished. He is active and cooperative. He does not appear ill. No distress.  HENT:  Head: Normocephalic and atraumatic.  Mouth/Throat: Uvula is midline, oropharynx is clear and moist and mucous membranes are normal. Mucous membranes are not pale, not dry and not cyanotic.  Eyes: Conjunctivae, EOM and lids are  normal. Pupils are equal, round, and reactive to light.  Neck: Trachea normal, normal range of motion and full passive range of motion without pain. Neck supple. No JVD present. No tracheal deviation, no edema and no erythema present. No thyromegaly present.  Cardiovascular: Normal rate, normal heart sounds, intact distal pulses and normal pulses.  An irregular rhythm present. Exam reveals no gallop, no distant heart sounds and no friction rub.   No murmur heard. Pulmonary/Chest: Effort normal and breath sounds normal. No accessory muscle usage. No respiratory distress. He has no wheezes. He has no rhonchi. He has no rales. He exhibits no tenderness.  Abdominal: Normal appearance and bowel sounds are normal. He exhibits no distension and no ascites. There is no tenderness.  Genitourinary:  Genitourinary Comments: Foley in place  Musculoskeletal: Normal range of motion. He exhibits no edema or tenderness.  Expected osteoarthritis, stiffness  Neurological: He  is alert and oriented to person, place, and time. He has normal strength.  Skin: Skin is warm, dry and intact. He is not diaphoretic. No cyanosis. There is pallor. Nails show no clubbing.     Lt. ischial injury continues to drain pus from wound, possibly from undermining area especially, when areas 9-12 o'clock palpated. Wound is non healing with 60%  slough and 40% pink. Debrided about 2%, tolerated without event.   Psychiatric: He has a normal mood and affect. His speech is normal and behavior is normal. Judgment and thought content normal. Cognition and memory are normal.  Increased fentanyl patch for pain management  Nursing note and vitals reviewed.   Labs reviewed:  Recent Labs  05/06/16 1400 05/14/16 0500 05/16/16 0100 05/19/16 2120 05/23/16 0330 05/24/16 0040  NA 131* 122* 124* 126*  --   --   K 4.0 4.4 4.5 3.9  --   --   CL 94* 88* 93* 94*  --   --   CO2 _0 --   --   GLUCOSE 248* 265* 273* 182*  --   --   BUN 22* 41* 34* 28*  28* 16 17  CREATININE 0.37* 0.64 0.39* 0.43*  0.40* <0.30* 0.31*  CALCIUM 9.8 9.1 8.9 9.0  --   --   MG 1.6* 1.9  --   --   --   --     Recent Labs  03/08/16 1357 05/06/16 1400 05/14/16 0500  AST 31 19 45*  ALT 18 11* 23  ALKPHOS 68 68 124  BILITOT 1.2 0.3 0.7  PROT 5.6* 6.7 5.6*  ALBUMIN 2.4* 2.4* 1.8*    Recent Labs  05/14/16 0500 05/16/16 0100 05/19/16 2120  WBC 20.7* 11.7* 14.7*  NEUTROABS 18.8* 10.2* 12.8*  HGB 7.8* 8.3* 9.1*  HCT 23.6* 25.7* 27.9*  MCV 83.2 83.3 83.1  PLT 285 267 352   Lab Results  Component Value Date   TSH 1.026 05/06/2016   Lab Results  Component Value Date   HGBA1C 9.3 (H) 05/06/2016   No results found for: CHOL, HDL, LDLCALC, LDLDIRECT, TRIG, CHOLHDL  Significant Diagnostic Results in last 30 days:  No results found.  Assessment/Plan 1. Decubitus Ulcer  D/C wound vac order- unable to apply due purulent drainage pockets  Continue Santyl and damp to dry NS gauze  BID  Continue IV Vancomycin and Levaquin  Continue foley for urine containment to avoid contamination  Arrange appointment with Dr Fleet Contras for surgical consult per son's request  Increase Fentanyl patch to 25 mcg Q 3 days for pain  management  2. Failure to Thrive  Continue Glucerna TID  Continue ProStat BID  Mirtazepine 7.5 mg po QHS for appetite    Family/ staff Communication:   Total Time: 65 minutes  Documentation: 15 minutes  Face to Face: 20 minutes  Family/Phone: 15 minutes talking to son, 15 minutes communicating with Palliative Care NP  Labs/tests ordered:  Cbc, met c, crp, esr  Medication list reviewed and assessed for continued appropriateness.  Vikki Ports, NP-C Geriatrics [facility] [phone], [place] [address] Cell Phone (Mon-Fri 8am-5pm):  [phone] On Call:  [phone] & follow prompts after 5pm & weekends Office Phone:  [phone] Office Fax:  [phone]

## 2016-05-27 ENCOUNTER — Emergency Department: Payer: Medicare Other

## 2016-05-27 ENCOUNTER — Inpatient Hospital Stay: Payer: Medicare Other

## 2016-05-27 ENCOUNTER — Inpatient Hospital Stay
Admission: EM | Admit: 2016-05-27 | Discharge: 2016-06-02 | DRG: 871 | Disposition: A | Discharge status: Hospice | Payer: Medicare Other | Attending: Internal Medicine | Admitting: Internal Medicine

## 2016-05-27 DIAGNOSIS — E119 Type 2 diabetes mellitus without complications: Secondary | ICD-10-CM | POA: Diagnosis present

## 2016-05-27 DIAGNOSIS — J810 Acute pulmonary edema: Secondary | ICD-10-CM

## 2016-05-27 DIAGNOSIS — N390 Urinary tract infection, site not specified: Secondary | ICD-10-CM

## 2016-05-27 DIAGNOSIS — R6520 Severe sepsis without septic shock: Secondary | ICD-10-CM | POA: Diagnosis not present

## 2016-05-27 DIAGNOSIS — L89154 Pressure ulcer of sacral region, stage 4: Secondary | ICD-10-CM | POA: Diagnosis present

## 2016-05-27 DIAGNOSIS — J9690 Respiratory failure, unspecified, unspecified whether with hypoxia or hypercapnia: Secondary | ICD-10-CM

## 2016-05-27 DIAGNOSIS — R627 Adult failure to thrive: Secondary | ICD-10-CM | POA: Diagnosis present

## 2016-05-27 DIAGNOSIS — R140 Abdominal distension (gaseous): Secondary | ICD-10-CM

## 2016-05-27 DIAGNOSIS — B3749 Other urogenital candidiasis: Secondary | ICD-10-CM | POA: Diagnosis present

## 2016-05-27 DIAGNOSIS — G9340 Encephalopathy, unspecified: Secondary | ICD-10-CM | POA: Diagnosis present

## 2016-05-27 DIAGNOSIS — Z789 Other specified health status: Secondary | ICD-10-CM | POA: Diagnosis not present

## 2016-05-27 DIAGNOSIS — R0682 Tachypnea, not elsewhere classified: Secondary | ICD-10-CM

## 2016-05-27 DIAGNOSIS — L89153 Pressure ulcer of sacral region, stage 3: Secondary | ICD-10-CM | POA: Diagnosis present

## 2016-05-27 DIAGNOSIS — R34 Anuria and oliguria: Secondary | ICD-10-CM | POA: Diagnosis not present

## 2016-05-27 DIAGNOSIS — R443 Hallucinations, unspecified: Secondary | ICD-10-CM | POA: Diagnosis present

## 2016-05-27 DIAGNOSIS — Y95 Nosocomial condition: Secondary | ICD-10-CM | POA: Diagnosis present

## 2016-05-27 DIAGNOSIS — Z66 Do not resuscitate: Secondary | ICD-10-CM

## 2016-05-27 DIAGNOSIS — E871 Hypo-osmolality and hyponatremia: Secondary | ICD-10-CM | POA: Diagnosis present

## 2016-05-27 DIAGNOSIS — Z7901 Long term (current) use of anticoagulants: Secondary | ICD-10-CM

## 2016-05-27 DIAGNOSIS — I252 Old myocardial infarction: Secondary | ICD-10-CM

## 2016-05-27 DIAGNOSIS — Z880 Allergy status to penicillin: Secondary | ICD-10-CM

## 2016-05-27 DIAGNOSIS — Z86711 Personal history of pulmonary embolism: Secondary | ICD-10-CM

## 2016-05-27 DIAGNOSIS — J9601 Acute respiratory failure with hypoxia: Secondary | ICD-10-CM

## 2016-05-27 DIAGNOSIS — L8990 Pressure ulcer of unspecified site, unspecified stage: Secondary | ICD-10-CM

## 2016-05-27 DIAGNOSIS — A419 Sepsis, unspecified organism: Principal | ICD-10-CM | POA: Diagnosis present

## 2016-05-27 DIAGNOSIS — I959 Hypotension, unspecified: Secondary | ICD-10-CM | POA: Diagnosis present

## 2016-05-27 DIAGNOSIS — I509 Heart failure, unspecified: Secondary | ICD-10-CM

## 2016-05-27 DIAGNOSIS — R6521 Severe sepsis with septic shock: Secondary | ICD-10-CM | POA: Diagnosis present

## 2016-05-27 DIAGNOSIS — J189 Pneumonia, unspecified organism: Secondary | ICD-10-CM | POA: Diagnosis present

## 2016-05-27 DIAGNOSIS — Z79899 Other long term (current) drug therapy: Secondary | ICD-10-CM

## 2016-05-27 DIAGNOSIS — Z01818 Encounter for other preprocedural examination: Secondary | ICD-10-CM

## 2016-05-27 DIAGNOSIS — E440 Moderate protein-calorie malnutrition: Secondary | ICD-10-CM | POA: Insufficient documentation

## 2016-05-27 DIAGNOSIS — Z8546 Personal history of malignant neoplasm of prostate: Secondary | ICD-10-CM

## 2016-05-27 DIAGNOSIS — D649 Anemia, unspecified: Secondary | ICD-10-CM | POA: Diagnosis present

## 2016-05-27 DIAGNOSIS — J9600 Acute respiratory failure, unspecified whether with hypoxia or hypercapnia: Secondary | ICD-10-CM

## 2016-05-27 DIAGNOSIS — I482 Chronic atrial fibrillation: Secondary | ICD-10-CM | POA: Diagnosis present

## 2016-05-27 DIAGNOSIS — E8779 Other fluid overload: Secondary | ICD-10-CM | POA: Diagnosis not present

## 2016-05-27 DIAGNOSIS — Z515 Encounter for palliative care: Secondary | ICD-10-CM | POA: Diagnosis not present

## 2016-05-27 DIAGNOSIS — J9621 Acute and chronic respiratory failure with hypoxia: Secondary | ICD-10-CM | POA: Diagnosis present

## 2016-05-27 DIAGNOSIS — L89622 Pressure ulcer of left heel, stage 2: Secondary | ICD-10-CM | POA: Diagnosis present

## 2016-05-27 DIAGNOSIS — J690 Pneumonitis due to inhalation of food and vomit: Secondary | ICD-10-CM

## 2016-05-27 DIAGNOSIS — I5021 Acute systolic (congestive) heart failure: Secondary | ICD-10-CM | POA: Diagnosis not present

## 2016-05-27 DIAGNOSIS — I2510 Atherosclerotic heart disease of native coronary artery without angina pectoris: Secondary | ICD-10-CM | POA: Diagnosis present

## 2016-05-27 DIAGNOSIS — Z794 Long term (current) use of insulin: Secondary | ICD-10-CM

## 2016-05-27 DIAGNOSIS — L089 Local infection of the skin and subcutaneous tissue, unspecified: Secondary | ICD-10-CM | POA: Diagnosis present

## 2016-05-27 HISTORY — DX: Pressure ulcer of sacral region, unspecified stage: L89.159

## 2016-05-27 LAB — CBC WITH DIFFERENTIAL/PLATELET
BASOS ABS: 0 10*3/uL (ref 0–0.1)
Basophils Absolute: 0.2 10*3/uL — ABNORMAL HIGH (ref 0–0.1)
Basophils Relative: 0 %
Basophils Relative: 1 %
EOS PCT: 1 %
Eosinophils Absolute: 0.1 10*3/uL (ref 0–0.7)
Eosinophils Absolute: 0.1 10*3/uL (ref 0–0.7)
Eosinophils Relative: 1 %
HEMATOCRIT: 27.4 % — AB (ref 40.0–52.0)
HEMATOCRIT: 30 % — AB (ref 40.0–52.0)
Hemoglobin: 8.7 g/dL — ABNORMAL LOW (ref 13.0–18.0)
Hemoglobin: 9.9 g/dL — ABNORMAL LOW (ref 13.0–18.0)
LYMPHS PCT: 4 %
Lymphocytes Relative: 6 %
Lymphs Abs: 0.9 10*3/uL — ABNORMAL LOW (ref 1.0–3.6)
Lymphs Abs: 0.7 10*3/uL — ABNORMAL LOW (ref 1.0–3.6)
MCH: 26.1 pg (ref 26.0–34.0)
MCH: 26.7 pg (ref 26.0–34.0)
MCHC: 31.8 g/dL — ABNORMAL LOW (ref 32.0–36.0)
MCHC: 32.9 g/dL (ref 32.0–36.0)
MCV: 82 fL (ref 80.0–100.0)
MCV: 81 fL (ref 80.0–100.0)
MONO ABS: 0.5 10*3/uL (ref 0.2–1.0)
MONO ABS: 0.2 10*3/uL (ref 0.2–1.0)
MONOS PCT: 2 %
Monocytes Relative: 3 %
NEUTROS ABS: 14.6 10*3/uL — AB (ref 1.4–6.5)
Neutro Abs: 14.2 10*3/uL — ABNORMAL HIGH (ref 1.4–6.5)
Neutrophils Relative %: 90 %
Neutrophils Relative %: 92 %
Platelets: 265 10*3/uL (ref 150–440)
Platelets: 313 10*3/uL (ref 150–440)
RBC: 3.34 MIL/uL — ABNORMAL LOW (ref 4.40–5.90)
RBC: 3.7 MIL/uL — ABNORMAL LOW (ref 4.40–5.90)
RDW: 18.8 % — AB (ref 11.5–14.5)
RDW: 19.1 % — AB (ref 11.5–14.5)
WBC: 15.7 10*3/uL — AB (ref 3.8–10.6)
WBC: 15.7 10*3/uL — ABNORMAL HIGH (ref 3.8–10.6)

## 2016-05-27 LAB — COMPREHENSIVE METABOLIC PANEL
ALK PHOS: 114 U/L (ref 38–126)
ALT: 13 U/L — ABNORMAL LOW (ref 17–63)
ALT: 15 U/L — AB (ref 17–63)
ANION GAP: 7 (ref 5–15)
AST: 24 U/L (ref 15–41)
AST: 27 U/L (ref 15–41)
Albumin: 1.7 g/dL — ABNORMAL LOW (ref 3.5–5.0)
Albumin: 1.8 g/dL — ABNORMAL LOW (ref 3.5–5.0)
Alkaline Phosphatase: 104 U/L (ref 38–126)
Anion gap: 7 (ref 5–15)
BILIRUBIN TOTAL: 0.3 mg/dL (ref 0.3–1.2)
BILIRUBIN TOTAL: 0.5 mg/dL (ref 0.3–1.2)
BUN: 33 mg/dL — AB (ref 6–20)
BUN: 40 mg/dL — ABNORMAL HIGH (ref 6–20)
CALCIUM: 9.6 mg/dL (ref 8.9–10.3)
CO2: 28 mmol/L (ref 22–32)
CO2: 28 mmol/L (ref 22–32)
CREATININE: 0.48 mg/dL — AB (ref 0.61–1.24)
CREATININE: 0.54 mg/dL — AB (ref 0.61–1.24)
Calcium: 9.6 mg/dL (ref 8.9–10.3)
Chloride: 95 mmol/L — ABNORMAL LOW (ref 101–111)
Chloride: 95 mmol/L — ABNORMAL LOW (ref 101–111)
GFR calc Af Amer: >60 mL/min (ref >60)
GFR calc non Af Amer: >60 mL/min (ref >60)
GLUCOSE: 52 mg/dL — AB (ref 65–99)
Glucose, Bld: 48 mg/dL — ABNORMAL LOW (ref 65–99)
POTASSIUM: 4.9 mmol/L (ref 3.5–5.1)
Potassium: 4.7 mmol/L (ref 3.5–5.1)
Sodium: 130 mmol/L — ABNORMAL LOW (ref 135–145)
Sodium: 130 mmol/L — ABNORMAL LOW (ref 135–145)
TOTAL PROTEIN: 5.5 g/dL — AB (ref 6.5–8.1)
TOTAL PROTEIN: 6 g/dL — AB (ref 6.5–8.1)

## 2016-05-27 LAB — URINALYSIS COMPLETE WITH MICROSCOPIC (ARMC ONLY)
Bilirubin Urine: NEGATIVE
Glucose, UA: NEGATIVE mg/dL
KETONES UR: NEGATIVE mg/dL
Nitrite: NEGATIVE
PH: 6 (ref 5.0–8.0)
PROTEIN: NEGATIVE mg/dL
Specific Gravity, Urine: 1.006 (ref 1.005–1.030)

## 2016-05-27 LAB — VANCOMYCIN, RANDOM: VANCOMYCIN RM: 30

## 2016-05-27 LAB — BRAIN NATRIURETIC PEPTIDE: B NATRIURETIC PEPTIDE 5: 169 pg/mL — AB (ref 0.0–100.0)

## 2016-05-27 LAB — SEDIMENTATION RATE: SED RATE: 63 mm/h — AB (ref 0–20)

## 2016-05-27 LAB — LACTIC ACID, PLASMA: Lactic Acid, Venous: 1.6 mmol/L (ref 0.5–1.9)

## 2016-05-27 MED ORDER — KETAMINE HCL 50 MG/ML IJ SOLN
INTRAMUSCULAR | Status: AC
  Filled 2016-05-27: qty 10

## 2016-05-27 MED ORDER — NOREPINEPHRINE 4 MG/250ML-% IV SOLN
0.0000 ug/min | INTRAVENOUS | Status: DC
  Administered 2016-05-27: 10 ug/min via INTRAVENOUS

## 2016-05-27 MED ORDER — FAMOTIDINE IN NACL 20-0.9 MG/50ML-% IV SOLN
20.0000 mg | Freq: Two times a day (BID) | INTRAVENOUS | Status: DC
  Administered 2016-05-28 – 2016-06-01 (×10): 20 mg via INTRAVENOUS
  Filled 2016-05-27 (×11): qty 50

## 2016-05-27 MED ORDER — KETAMINE HCL 10 MG/ML IJ SOLN
INTRAMUSCULAR | Status: DC | PRN
  Administered 2016-05-27: 100 mg via INTRAVENOUS

## 2016-05-27 MED ORDER — APIXABAN 5 MG PO TABS
5.0000 mg | ORAL_TABLET | Freq: Two times a day (BID) | ORAL | Status: DC
  Administered 2016-05-28: 5 mg via ORAL
  Filled 2016-05-27: qty 1

## 2016-05-27 MED ORDER — FENTANYL 2500MCG IN NS 250ML (10MCG/ML) PREMIX INFUSION
25.0000 ug/h | INTRAVENOUS | Status: DC
  Administered 2016-05-28: 50 ug/h via INTRAVENOUS
  Administered 2016-05-28: 100 ug/h via INTRAVENOUS
  Administered 2016-05-30: 50 ug/h via INTRAVENOUS
  Filled 2016-05-27 (×2): qty 250

## 2016-05-27 MED ORDER — MAGNESIUM OXIDE 400 (241.3 MG) MG PO TABS
400.0000 mg | ORAL_TABLET | Freq: Two times a day (BID) | ORAL | Status: DC
  Administered 2016-05-28: 400 mg via ORAL
  Filled 2016-05-27: qty 1

## 2016-05-27 MED ORDER — SODIUM CHLORIDE 0.9 % IV SOLN
INTRAVENOUS | Status: DC
  Administered 2016-05-28 – 2016-05-31 (×2): via INTRAVENOUS

## 2016-05-27 MED ORDER — DEXTROSE 5 % IV SOLN
2.0000 g | Freq: Once | INTRAVENOUS | Status: AC
  Administered 2016-05-27: 2 g via INTRAVENOUS
  Filled 2016-05-27: qty 2

## 2016-05-27 MED ORDER — VANCOMYCIN HCL IN DEXTROSE 1-5 GM/200ML-% IV SOLN
1000.0000 mg | Freq: Once | INTRAVENOUS | Status: DC
  Filled 2016-05-27: qty 200

## 2016-05-27 MED ORDER — MIDAZOLAM HCL 2 MG/2ML IJ SOLN: 1.0000 mg | INTRAMUSCULAR | Status: DC | PRN

## 2016-05-27 MED ORDER — ATORVASTATIN CALCIUM 10 MG PO TABS
10.0000 mg | ORAL_TABLET | Freq: Every day | ORAL | Status: DC
  Administered 2016-05-28: 10 mg via ORAL
  Filled 2016-05-27: qty 1

## 2016-05-27 MED ORDER — FENTANYL CITRATE (PF) 100 MCG/2ML IJ SOLN
50.0000 ug | Freq: Once | INTRAMUSCULAR | Status: AC
  Administered 2016-05-28: 50 ug via INTRAVENOUS

## 2016-05-27 MED ORDER — SENNOSIDES 8.8 MG/5ML PO SYRP
5.0000 mL | ORAL_SOLUTION | Freq: Two times a day (BID) | ORAL | Status: DC | PRN
  Filled 2016-05-27: qty 5

## 2016-05-27 MED ORDER — DIGOXIN 125 MCG PO TABS
0.1250 mg | ORAL_TABLET | Freq: Every day | ORAL | Status: DC
  Administered 2016-05-28: 0.125 mg via ORAL
  Filled 2016-05-27: qty 1

## 2016-05-27 MED ORDER — BISACODYL 10 MG RE SUPP: 10.0000 mg | Freq: Every day | RECTAL | Status: DC | PRN

## 2016-05-27 MED ORDER — ONDANSETRON HCL 4 MG/2ML IJ SOLN: 4.0000 mg | Freq: Four times a day (QID) | INTRAMUSCULAR | Status: DC | PRN

## 2016-05-27 MED ORDER — ALBUTEROL SULFATE (2.5 MG/3ML) 0.083% IN NEBU
10.0000 mg/h | INHALATION_SOLUTION | Freq: Once | RESPIRATORY_TRACT | Status: AC
  Administered 2016-05-27: 10 mg/h via RESPIRATORY_TRACT
  Filled 2016-05-27: qty 12

## 2016-05-27 MED ORDER — NOREPINEPHRINE BITARTRATE 1 MG/ML IV SOLN
2.0000 ug/min | Freq: Once | INTRAVENOUS | Status: AC
  Administered 2016-05-27: 2 ug/min via INTRAVENOUS

## 2016-05-27 MED ORDER — FENTANYL BOLUS VIA INFUSION
25.0000 ug | INTRAVENOUS | Status: DC | PRN
  Filled 2016-05-27: qty 25

## 2016-05-27 MED ORDER — NOREPINEPHRINE 4 MG/250ML-% IV SOLN
INTRAVENOUS | Status: AC
  Administered 2016-05-27: 10 ug/min via INTRAVENOUS
  Filled 2016-05-27: qty 250

## 2016-05-27 MED ORDER — INSULIN ASPART 100 UNIT/ML ~~LOC~~ SOLN: 3.0000 [IU] | Freq: Three times a day (TID) | SUBCUTANEOUS | Status: DC

## 2016-05-27 MED ORDER — FENTANYL 2500MCG IN NS 250ML (10MCG/ML) PREMIX INFUSION
20.0000 ug/h | INTRAVENOUS | Status: DC
  Administered 2016-05-27: 50 ug/h via INTRAVENOUS
  Filled 2016-05-27: qty 250

## 2016-05-27 MED ORDER — FUROSEMIDE 10 MG/ML IJ SOLN
20.0000 mg | Freq: Once | INTRAMUSCULAR | Status: DC
  Filled 2016-05-27: qty 2

## 2016-05-27 MED ORDER — ACETAMINOPHEN 325 MG PO TABS
650.0000 mg | ORAL_TABLET | ORAL | Status: DC | PRN
  Filled 2016-05-27: qty 2

## 2016-05-27 MED ORDER — FUROSEMIDE 40 MG PO TABS: 20.0000 mg | ORAL_TABLET | Freq: Every day | ORAL | Status: DC

## 2016-05-27 MED ORDER — ROCURONIUM BROMIDE 50 MG/5ML IV SOLN
INTRAVENOUS | Status: DC | PRN
  Administered 2016-05-27: 100 mg via INTRAVENOUS

## 2016-05-27 MED ORDER — INSULIN ASPART 100 UNIT/ML ~~LOC~~ SOLN
2.0000 [IU] | SUBCUTANEOUS | Status: DC
  Administered 2016-05-28 – 2016-05-30 (×7): 2 [IU] via SUBCUTANEOUS
  Administered 2016-05-30: 4 [IU] via SUBCUTANEOUS
  Administered 2016-05-30: 2 [IU] via SUBCUTANEOUS
  Filled 2016-05-27 (×5): qty 2
  Filled 2016-05-27: qty 4
  Filled 2016-05-27 (×3): qty 2

## 2016-05-27 MED ORDER — SODIUM CHLORIDE 0.9 % IV SOLN: 250.0000 mL | INTRAVENOUS | Status: DC | PRN

## 2016-05-27 MED ORDER — ENSURE ENLIVE PO LIQD: 237.0000 mL | Freq: Three times a day (TID) | ORAL | Status: DC

## 2016-05-27 MED ORDER — ADULT MULTIVITAMIN W/MINERALS CH: 1.0000 | ORAL_TABLET | Freq: Every day | ORAL | Status: DC

## 2016-05-27 NOTE — ED Notes (Signed)
Paul Mcmahon, third son called and speaking to MD at this time. Pt requesting to be intubated at this time and family reporting they want pt treated at this time as well.

## 2016-05-27 NOTE — Progress Notes (Addendum)
Pharmacy Antibiotic Note  Paul Mcmahon is a 82 y.o. male admitted on 05/27/2016 with pneumonia.  Pharmacy has been consulted for cefepime and vancomycin dosing.  Plan: 1. Cefepime 2 gm IV Q8H 2. Patient is on vancomycin outpatient. RN didn't know when last dose was administered. Will check a random level, then resume home dosing if appropriate.   08/04 2249 vancomycin random level 30 mcg/mL. Unsure how much of the first dose patient received in ED. Tried to call nurse x 2, phone busy both times. Will recheck level at 0500 to inform dosing.      No data recorded.   Recent Labs Lab 05/23/16 0330 05/24/16 0040 05/26/16 1050 05/27/16 0940 05/27/16 2102  WBC  --   --   --  15.7* 15.7*  CREATININE <0.30* 0.31* 0.46* 0.48* 0.54*  VANCOTROUGH 18 20 18   --   --     CrCl cannot be calculated (Unknown ideal weight.).    Allergies  Allergen Reactions  . Penicillins Other (See Comments)    Reaction: Welts and blisters all over body    Thank you for allowing pharmacy to be a part of this patient's care.  Laural Benes, Pharm.D., BCPS Clinical Pharmacist 05/27/2016 9:48 PM

## 2016-05-27 NOTE — H&P (Signed)
PULMONARY / CRITICAL CARE MEDICINE   Name: ROLLAN ELSENPETER MRN: [id number] DOB: 12-24-1924    ADMISSION DATE:  05/27/2016  REFERRING MD: Dr. Quentin Cornwall  CHIEF COMPLAINT:  AMS, hypoxia  HISTORY OF PRESENT ILLNESS:   This is an 82 yo wm, SNF resident with a PMH of afib on Eliquis, prostate Ca, T2DM and CAD S/P MI who presents with hypoxia with SPO2 in the 62s, AMS and suspected aspiration. History is obtained from ED records as patient is currently intubated and sedated. Apparently, SNF staff noted that patient was "not looking right" after dinner hence they checked his vital signs and found that his SPO2 was in the 60s. He was placed on O2 via [place] without improvement hence EMS was called. Upon EMS arrival, patient was still hypoxic hence he was placed on CPAP.  His SPO2 improved to 93% but kept fluctuating in the low 80s. While in the ED, became tachypneic, hypoxic on BiPAP and hypotensive with blood pressure of 61/52. He was asked if he wanted to be intubated and he rescinded the DNI and was intubated. He remains a DNR.   He continues require high f1O2 and remains hypothermic. He was on vancomycin IV at the SNF for infected decub ulcers. Random vanc level in the ED was 30.    PAST MEDICAL HISTORY :  He  has a past medical history of A-fib ([place]); Cancer ([place]); Diabetes mellitus without complication ([place]); and MI (myocardial infarction) ([place]).  PAST SURGICAL HISTORY: He  has a past surgical history that includes Cardiac catheterization (N/A, 03/11/2016).  Allergies  Allergen Reactions  . Penicillins Other (See Comments)    Reaction: Welts and blisters all over body    No current facility-administered medications on file prior to encounter.    Current Outpatient Prescriptions on File Prior to Encounter  Medication Sig  . acetaminophen (TYLENOL) 325 MG tablet Take 2 tablets (650 mg total) by mouth every 6 (six) hours as needed for mild pain (or Fever >/= 101).  Marland Kitchen apixaban (ELIQUIS) 5 MG TABS  tablet Take 1 tablet (5 mg total) by mouth 2 (two) times daily.  Marland Kitchen atorvastatin (LIPITOR) 10 MG tablet Take 10 mg by mouth at bedtime.  Marland Kitchen b complex vitamins tablet Take 1 tablet by mouth daily.  . cholecalciferol (VITAMIN D) 1000 units tablet Take 1,000 Units by mouth daily.  . digoxin (LANOXIN) 0.125 MG tablet Take 0.125 mg by mouth daily.  . feeding supplement, ENSURE ENLIVE, (ENSURE ENLIVE) LIQD Take 237 mLs by mouth 3 (three) times daily after meals.  . furosemide (LASIX) 20 MG tablet Take 20 mg by mouth daily.  . insulin aspart (NOVOLOG) 100 UNIT/ML injection Inject 3 Units into the skin 3 (three) times daily with meals. (Patient taking differently: Inject 5 Units into the skin 3 (three) times daily with meals. )  . magnesium oxide (MAG-OX) 400 (241.3 Mg) MG tablet Take 400 mg by mouth 2 (two) times daily.  . metoprolol succinate (TOPROL-XL) 50 MG 24 hr tablet Take 50 mg by mouth daily. Take with or immediately following a meal.  . Multiple Vitamin (MULTIVITAMIN WITH MINERALS) TABS tablet Take 1 tablet by mouth daily.  . ondansetron (ZOFRAN) 4 MG tablet Take 1 tablet (4 mg total) by mouth every 6 (six) hours as needed for nausea.  Marland Kitchen oxyCODONE (OXY IR/ROXICODONE) 5 MG immediate release tablet Take 1 tablet (5 mg total) by mouth every 4 (four) hours as needed for moderate pain.  . tamsulosin (FLOMAX) 0.4 MG CAPS  capsule Take 1 capsule (0.4 mg total) by mouth daily after supper.    FAMILY HISTORY:  His has no family status information on file.    SOCIAL HISTORY: He  reports that he has never smoked. He does not have any smokeless tobacco history on file.  REVIEW OF SYSTEMS:   Unable to obtain as patient is intubated and sedated  SUBJECTIVE:    VITAL SIGNS: BP 110/63   Pulse 95   Temp (!) 93.4 F (34.1 C)   Resp 12   SpO2 92%   HEMODYNAMICS:    VENTILATOR SETTINGS: Vent Mode: AC FiO2 (%):  [50 %] 50 % Set Rate:  [12 bmp] 12 bmp Vt Set:  [500 mL] 500 mL PEEP:  [5 cmH20] 5  cmH20  INTAKE / OUTPUT: No intake/output data recorded.  PHYSICAL EXAMINATION: General: Chronically ill-looking male Neuro: Pupils reactive, +corneals, withdraws to pain, +gag HEENT: Echo/AT, PERRLA, ETT, OGT to low suction,  moderate secretions, trachea midline Cardiovascular:  Irregular-irregular, S1/S2, no MRG Lungs: Bilateral airflow but diminished in the bases, +rhonchi in all lung fields Abdomen: Non-distended, normal bowel sounds Musculoskeletal: mild contractures in BLLE Extremities: +2 pulses, no venous stasis discoloration Skin:2  Large sacral decub ulcers with foul smell and purulent drainage; stage 4 with tunneling; dry flaky skin in BLLE and right heel stage 2 ulcer  LABS:  BMET  Recent Labs Lab 05/26/16 1050 05/27/16 0940 05/27/16 2102  NA 131* 130* 130*  K 4.5 4.9 4.7  CL 97* 95* 95*  CO2 27 28 28   BUN 26* 33* 40*  CREATININE 0.46* 0.48* 0.54*  GLUCOSE 58* 48* 52*    Electrolytes  Recent Labs Lab 05/26/16 1050 05/27/16 0940 05/27/16 2102  CALCIUM 9.2 9.6 9.6    CBC  Recent Labs Lab 05/27/16 0940 05/27/16 2102  WBC 15.7* 15.7*  HGB 8.7* 9.9*  HCT 27.4* 30.0*  PLT 265 313    Coag's No results for input(s): APTT, INR in the last 168 hours.  Sepsis Markers  Recent Labs Lab 05/27/16 2119  LATICACIDVEN 1.6    ABG  Recent Labs Lab 05/27/16 2150  PHART 7.39  PCO2ART 50*  PO2ART 93    Liver Enzymes  Recent Labs Lab 05/27/16 0940 05/27/16 2102  AST 24 27  ALT 13* 15*  ALKPHOS 104 114  BILITOT 0.3 0.5  ALBUMIN 1.7* 1.8*    Cardiac Enzymes  Recent Labs Lab 05/27/16 2102  TROPONINI 0.03*    Glucose  Recent Labs Lab 05/24/16 0541 05/24/16 1156 05/24/16 2138 05/25/16 1123 05/25/16 1703 05/25/16 2010  GLUCAP 67 116* 261* 127* 108* 113*    Imaging Dg Chest Portable 1 View  Result Date: 05/27/2016 CLINICAL DATA:  Respiratory distress. EXAM: PORTABLE CHEST 1 VIEW COMPARISON:  Chest x-ray dated 03/08/2016.  FINDINGS: Cardiomegaly, slightly increased in prominence compared to the earlier chest x-ray. Atherosclerotic changes at the aortic arch. Central pulmonary vascular congestion and bilateral pulmonary edema pattern. Suspect small bilateral pleural effusions. No pneumothorax seen. Presumed left-sided PICC line in place with tip at the upper margin of the SVC. IMPRESSION: 1. Cardiomegaly with central pulmonary vascular congestion and bilateral pulmonary edema, presumed CHF. 2. Probable small bilateral pleural effusions. 3. Aortic atherosclerosis. Electronically Signed   By: Franki Cabot M.D.   On: 05/27/2016 21:20    STUDIES:  2-d echo>  CULTURES: 08/04 Blood x 2 Urine Sputum Wound  ANTIBIOTICS: Vancomycin and cefepime 08/04>  SIGNIFICANT EVENTS: 08/04>ED with pneumonia, sepsis, CHF and respiratory failure  LINES/TUBES:  PIVs Left PICC line  DISCUSSION: 82 yo WM presenting with HCAP possibly aspiration pneumonia, sepsis from infected decub ulcers and pneumonia and septic shock  ASSESSMENT / PLAN:  PULMONARY A: Acute hypoxic respiratory failure Bilateral LL infiltrates; Right>>>left; consistent with penumonia P:   -Full vent support -VAP protocol -Nebulized bronchodilator -ABX -Daily CXR -daily ABG  CARDIOVASCULAR A:  Afib CAD S/P MI P:  -Hemodynamics per ICU -Continue eliquis -Continue home dose of digoxin and statin -2-D echo  RENAL A:   No acute issues P:   -Monitor and replace electrolytes -Monitor BMP daily  GASTROINTESTINAL A:   ? Dysphagia P:   -Swallow eval prior to discharge -Famotidine for GI prophylaxis -Keep NPO  HEMATOLOGIC A:   Anema-Hg/HCT stable at 9.9/30 P:  -trend CBC and transfuse when necessary  INFECTIOUS A:   Infected decub ulcers HCAP P:   -f/u cultures and fever curse -Continue ABx as above  ENDOCRINE A:   T2DM   P:   -Blood glucose monitoring  q4h with SSI coverage  NEUROLOGIC A:   Acute  encephalopathy-Metabolic versus hypoxic P:   RASS goal: -1 to 0 -Monitor mental ststus -Fentanyl and versed for vent sedation  Disposition and family update: Son updated by phone. Will be in tomorrow to see patient.  Magdalene S. [facility] ANP-BC Pulmonary and [place] Pager [phone] or [phone] 05/27/2016, 10:44 PM   PCCM ATTENDING ATTESTATION:  I have evaluated patient with ANP Patria Mane, reviewed database in its entirety and discussed care plan in detail. In addition, this patient was discussed on multidisciplinary rounds.   Important exam findings: RASS -4, not F/C No wheezes BP adequate on NE infusion Extremities cool Decubitus ulcer  Major problems addressed by PCCM team: Advanced age, chronic debilitation Severe sepsis Septic shock Acute hypoxic respiratory failure Pulmonary edema Oliguria  PLAN/REC: Cont full vent support - settings reviewed and/or adjusted Cont vent bundle Daily SBT if/when meets criteria Cont vasopressors Cont broad spectrum abx Monitor BMET intermittently Monitor I/Os Correct electrolytes as indicated DNR confirmed with pt's sons. They also do not wish for this type of care to be prolonged. If not significantly improved by Monday or Tues, will discuss terminal extubation   CCM time: 45 mins The above time includes time spent in consultation with patient and/or family members and reviewing care plan on multidisciplinary rounds  Merton Border, MD PCCM service Mobile [phone] Pager [phone]

## 2016-05-27 NOTE — ED Provider Notes (Signed)
[facility] Emergency Department Provider Note    First MD Initiated Contact with Patient 05/27/16 2056     (approximate)  I have reviewed the triage vital signs and the nursing notes.   HISTORY  Chief Complaint Respiratory Distress    HPI Paul Mcmahon is a 82 y.o. male with history of A. fib and cancer presents with acute hypoxic respiratory failure. Patient arrives with DO NOT RESUSCITATE and DO NOT INTUBATE signed. Per report from rehabilitation facility patient was otherwise acting normal today then after dinner became acutely hypoxic and had difficulty breathing.  History limited due to altered mental status the acute setting the patient does reiterate that he does not want to be placed on life-support. Every effort is being made to reach family at this time.  11:17 PM Patient now more lucid after being treated on BiPAP. Chest x-ray does suggest pneumonia with underlying congestive heart failure. Patient is agreeable to being placed on mechanical ventilation.With the patient's son and updated him on the patient's wishes and his critical status.   Past Medical History:  Diagnosis Date  . A-fib ([place])   . Cancer ([place])   . Diabetes mellitus without complication ([place])   . MI (myocardial infarction) [facility])     Patient Active Problem List   Diagnosis Date Noted  . Respiratory failure with hypoxia ([place]) 05/27/2016  . Protein-calorie malnutrition, severe 03/10/2016  . Acute urinary tract infection   . Atrial fibrillation with RVR ([place])   . Decubitus ulcer   . Failure to thrive (0-17)   . Sepsis ([place]) 03/08/2016  . Pressure ulcer 03/08/2016  . Arteriosclerosis of coronary artery 06/06/2014  . Controlled type 2 diabetes mellitus without complication ([place]) [id number]  . H/O malignant neoplasm of prostate 06/06/2014  . HLD (hyperlipidemia) 06/06/2014  . BP (high blood pressure) 06/06/2014  . Osteopenia 06/06/2014    Past Surgical History:  Procedure  Laterality Date  . PERIPHERAL VASCULAR CATHETERIZATION N/A 03/11/2016   Procedure: IVC Filter Insertion;  Surgeon: Algernon Huxley, MD;  Location: [place] [facility];  Service: Cardiovascular;  Laterality: N/A;    Prior to Admission medications   Medication Sig Start Date End Date Taking? Authorizing Provider  acetaminophen (TYLENOL) 325 MG tablet Take 2 tablets (650 mg total) by mouth every 6 (six) hours as needed for mild pain (or Fever >/= 101). 03/15/16  Yes Dustin Flock, MD  apixaban (ELIQUIS) 5 MG TABS tablet Take 1 tablet (5 mg total) by mouth 2 (two) times daily. 03/20/16  Yes Dustin Flock, MD  atorvastatin (LIPITOR) 10 MG tablet Take 10 mg by mouth at bedtime.   Yes Historical Provider, MD  b complex vitamins tablet Take 1 tablet by mouth daily.   Yes Historical Provider, MD  cholecalciferol (VITAMIN D) 1000 units tablet Take 1,000 Units by mouth daily.   Yes Historical Provider, MD  digoxin (LANOXIN) 0.125 MG tablet Take 0.125 mg by mouth daily.   Yes Historical Provider, MD  feeding supplement, ENSURE ENLIVE, (ENSURE ENLIVE) LIQD Take 237 mLs by mouth 3 (three) times daily after meals. 03/15/16  Yes Dustin Flock, MD  fentaNYL (DURAGESIC - DOSED MCG/HR) 25 MCG/HR patch Place 25 mcg onto the skin every 3 (three) days.   Yes Historical Provider, MD  ferrous sulfate 325 (65 FE) MG tablet Take 325 mg by mouth daily with breakfast.   Yes Historical Provider, MD  furosemide (LASIX) 20 MG tablet Take 20 mg by mouth daily.   Yes Historical  Provider, MD  insulin aspart (NOVOLOG) 100 UNIT/ML injection Inject 3 Units into the skin 3 (three) times daily with meals. Patient taking differently: Inject 5 Units into the skin 3 (three) times daily with meals.  03/15/16  Yes Dustin Flock, MD  insulin aspart protamine- aspart (NOVOLOG MIX 70/30) (70-30) 100 UNIT/ML injection Inject 12-22 Units into the skin 2 (two) times daily. 12 units at 5 pm  22 units at 9 am   Yes Historical Provider, MD    LEVOFLOXACIN IV Inject 750 mg/day into the vein daily. Given at 10 pm   Yes Historical Provider, MD  magnesium oxide (MAG-OX) 400 (241.3 Mg) MG tablet Take 400 mg by mouth 2 (two) times daily.   Yes Historical Provider, MD  metoprolol succinate (TOPROL-XL) 50 MG 24 hr tablet Take 50 mg by mouth daily. Take with or immediately following a meal.   Yes Historical Provider, MD  mirtazapine (REMERON) 7.5 MG tablet Take 7.5 mg by mouth at bedtime.   Yes Historical Provider, MD  Multiple Vitamin (MULTIVITAMIN WITH MINERALS) TABS tablet Take 1 tablet by mouth daily.   Yes Historical Provider, MD  ondansetron (ZOFRAN) 4 MG tablet Take 1 tablet (4 mg total) by mouth every 6 (six) hours as needed for nausea. 03/15/16  Yes Dustin Flock, MD  oxyCODONE (OXY IR/ROXICODONE) 5 MG immediate release tablet Take 1 tablet (5 mg total) by mouth every 4 (four) hours as needed for moderate pain. 03/15/16  Yes Dustin Flock, MD  polyethylene glycol (MIRALAX / GLYCOLAX) packet Take 17 g by mouth daily.   Yes Historical Provider, MD  sennosides-docusate sodium (SENOKOT-S) 8.6-50 MG tablet Take 1 tablet by mouth 2 (two) times daily.   Yes Historical Provider, MD  tamsulosin (FLOMAX) 0.4 MG CAPS capsule Take 1 capsule (0.4 mg total) by mouth daily after supper. 03/15/16  Yes Dustin Flock, MD  VANCOMYCIN HCL IV Inject 1.5 g into the vein daily. Given at 11 am   Yes Historical Provider, MD    Allergies Penicillins  No family history on file.  Social History Social History  Substance Use Topics  . Smoking status: Never Smoker  . Smokeless tobacco: Not on file  . Alcohol use Not on file    Review of Systems Patient denies headaches, rhinorrhea, blurry vision, numbness, shortness of breath, chest pain, edema, cough, abdominal pain, nausea, vomiting, diarrhea, dysuria, fevers, rashes or hallucinations unless otherwise stated above in HPI. ____________________________________________   PHYSICAL EXAM:  VITAL  SIGNS: Vitals:   05/27/16 2250 05/27/16 2300  BP: 110/71 105/67  Pulse: 94 84  Resp: 12 12  Temp: (!) 93.4 F (34.1 C) (!) 93.4 F (34.1 C)    Constitutional: critically ill appearing severe respiratory distress Eyes: Conjunctivae are normal. PERRL. EOMI. Head: Atraumatic. Nose: No congestion/rhinnorhea. Mouth/Throat: Mucous membranes are moist.  Oropharynx non-erythematous. Neck: No stridor., +JVD  Cardiovascular: Normal rate, irregular rhythm. Grossly normal heart sounds.  Slow cap refill. Respiratory: acute respiratory distress, inspiratory and expiratory rales bilaterally, worse in rLL. Gastrointestinal: Soft and nontender. No distention. No abdominal bruits.  Musculoskeletal:2+ bilateral edema.  No joint effusions. Neurologic:  Acutely encephalopathic Skin:  Cool and mottled  ____________________________________________   LABS (all labs ordered are listed, but only abnormal results are displayed)  Results for orders placed or performed during the hospital encounter of 05/27/16 (from the past 24 hour(s))  Comprehensive metabolic panel     Status: Abnormal   Collection Time: 05/27/16  9:02 PM  Result Value Ref Range  Sodium 130 (L) 135 - 145 mmol/L   Potassium 4.7 3.5 - 5.1 mmol/L   Chloride 95 (L) 101 - 111 mmol/L   CO2 28 22 - 32 mmol/L   Glucose, Bld 52 (L) 65 - 99 mg/dL   BUN 40 (H) 6 - 20 mg/dL   Creatinine, Ser 0.54 (L) 0.61 - 1.24 mg/dL   Calcium 9.6 8.9 - 10.3 mg/dL   Total Protein 6.0 (L) 6.5 - 8.1 g/dL   Albumin 1.8 (L) 3.5 - 5.0 g/dL   AST 27 15 - 41 U/L   ALT 15 (L) 17 - 63 U/L   Alkaline Phosphatase 114 38 - 126 U/L   Total Bilirubin 0.5 0.3 - 1.2 mg/dL   GFR calc non Af Amer >60 >60 mL/min   GFR calc Af Amer >60 >60 mL/min   Anion gap 7 5 - 15  CBC WITH DIFFERENTIAL     Status: Abnormal   Collection Time: 05/27/16  9:02 PM  Result Value Ref Range   WBC 15.7 (H) 3.8 - 10.6 K/uL   RBC 3.70 (L) 4.40 - 5.90 MIL/uL   Hemoglobin 9.9 (L) 13.0 - 18.0  g/dL   HCT 30.0 (L) 40.0 - 52.0 %   MCV 81.0 80.0 - 100.0 fL   MCH 26.7 26.0 - 34.0 pg   MCHC 32.9 32.0 - 36.0 g/dL   RDW 19.1 (H) 11.5 - 14.5 %   Platelets 313 150 - 440 K/uL   Neutrophils Relative % 92 %   Neutro Abs 14.6 (H) 1.4 - 6.5 K/uL   Lymphocytes Relative 4 %   Lymphs Abs 0.7 (L) 1.0 - 3.6 K/uL   Monocytes Relative 2 %   Monocytes Absolute 0.2 0.2 - 1.0 K/uL   Eosinophils Relative 1 %   Eosinophils Absolute 0.1 0 - 0.7 K/uL   Basophils Relative 1 %   Basophils Absolute 0.2 (H) 0 - 0.1 K/uL  Brain natriuretic peptide     Status: Abnormal   Collection Time: 05/27/16  9:02 PM  Result Value Ref Range   B Natriuretic Peptide 169.0 (H) 0.0 - 100.0 pg/mL  Troponin I     Status: Abnormal   Collection Time: 05/27/16  9:02 PM  Result Value Ref Range   Troponin I 0.03 (HH) <0.03 ng/mL  Vancomycin, random     Status: None   Collection Time: 05/27/16  9:02 PM  Result Value Ref Range   Vancomycin Rm 30   Lactic acid, plasma     Status: None   Collection Time: 05/27/16  9:19 PM  Result Value Ref Range   Lactic Acid, Venous 1.6 0.5 - 1.9 mmol/L  Urinalysis complete, with microscopic ([facility] only)     Status: Abnormal   Collection Time: 05/27/16  9:19 PM  Result Value Ref Range   Color, Urine YELLOW (A) YELLOW   APPearance CLOUDY (A) CLEAR   Glucose, UA NEGATIVE NEGATIVE mg/dL   Bilirubin Urine NEGATIVE NEGATIVE   Ketones, ur NEGATIVE NEGATIVE mg/dL   Specific Gravity, Urine 1.006 1.005 - 1.030   Hgb urine dipstick 3+ (A) NEGATIVE   pH 6.0 5.0 - 8.0   Protein, ur NEGATIVE NEGATIVE mg/dL   Nitrite NEGATIVE NEGATIVE   Leukocytes, UA 3+ (A) NEGATIVE   RBC / HPF TOO NUMEROUS TO COUNT 0 - 5 RBC/hpf   WBC, UA TOO NUMEROUS TO COUNT 0 - 5 WBC/hpf   Bacteria, UA MANY (A) NONE SEEN   Squamous Epithelial / LPF 0-5 (A) NONE SEEN  Mucous PRESENT    Budding Yeast PRESENT   Blood gas, arterial     Status: Abnormal (Preliminary result)   Collection Time: 05/27/16  9:50 PM  Result Value  Ref Range   FIO2 PENDING    VT 500 mL   Peep/cpap 5.0 cm H20   pH, Arterial 7.39 7.350 - 7.450   pCO2 arterial 50 (H) 32.0 - 48.0 mmHg   pO2, Arterial 93 83.0 - 108.0 mmHg   Bicarbonate 30.3 (H) 21.0 - 28.0 mEq/L   Acid-Base Excess 4.6 (H) 0.0 - 3.0 mmol/L   O2 Saturation 97.1 %   Patient temperature 37.0    Collection site RIGHT RADIAL    Sample type ARTERIAL DRAW    Allens test (pass/fail) PASS PASS   Mechanical Rate 12    ____________________________________________  EKG  Time: 21:01  Indication: hypotension  Rate: 75  Rhythm: afib Axis: normal Other: nonspecific t wave changes.  No st elevations ____________________________________________  RADIOLOGY IMPRESSION: 1. Cardiomegaly with central pulmonary vascular congestion and bilateral pulmonary edema, presumed CHF. 2. Probable small bilateral pleural effusions. 3. Aortic atherosclerosis. ____________________________________________   PROCEDURES  Procedure(s) performed: yes  INTUBATION Performed by: Merlyn Lot  Required items: required blood products, implants, devices, and special equipment available Patient identity confirmed: provided demographic data and hospital-assigned identification number Time out: Immediately prior to procedure a "time out" was called to verify the correct patient, procedure, equipment, support staff and site/side marked as required.  Indications: acute hypoxic respiratory failure  Intubation method: Glidescope Laryngoscopy   Preoxygenation: BVM  Sedatives: ketamine Paralytic: rocuronium  Tube Size: 8-o cuffed  Post-procedure assessment: chest rise and ETCO2 monitor Breath sounds: equal and absent over the epigastrium Tube secured with: ETT holder Chest x-ray interpreted by radiologist and me.  Chest x-ray findings: endotracheal tube in appropriate position  Patient tolerated the procedure well with no immediate complications.     Critical Care performed:  yes CRITICAL CARE Performed by: Merlyn Lot   Total critical care time: 45 minutes  Critical care time was exclusive of separately billable procedures and treating other patients.  Critical care was necessary to treat or prevent imminent or life-threatening deterioration.  Critical care was time spent personally by me on the following activities: development of treatment plan with patient and/or surrogate as well as nursing, discussions with consultants, evaluation of patient's response to treatment, examination of patient, obtaining history from patient or surrogate, ordering and performing treatments and interventions, ordering and review of laboratory studies, ordering and review of radiographic studies, pulse oximetry and re-evaluation of patient's condition.  ____________________________________________   INITIAL IMPRESSION / ASSESSMENT AND PLAN / ED COURSE  Pertinent labs & imaging results that were available during my care of the patient were reviewed by me and considered in my medical decision making (see chart for details).  DDX: Sepsis, congestive heart failure, cardiogenic shock, UTI, aspiration pneumonitis, P  ADALID HORRALL is a 82 y.o. who presents to the ED with acute hypoxic respiratory failure. Patient with complex past medical history including congestive heart failure as well as cancer. Patient currently on anticoagulations lower suspicion for PE. Patient coming from rehabilitation facility for acute as ulcers. Patient hypothermic and with acute hypoxic respiratory failure. Patient placed immediately on BiPAP upon arrival to the ER he was initially encephalopathic but had improvement after BiPAP. Patient persistently to In the 10s. Noted DO NOT RESUSCITATE and sharp. I discussed my concerns regarding the patient's tachypnea with the patient and he stated he would  agree to being placed on mechanical ventilation at this time the reader is that he does not want to be In the  Event That His Heart Stops. Patient Was Agreeable to Antibiotics and Pressor Agents. Patient Started on nor Epi for His Hypotension. Patient was intubated as above. Did not give the patient 80 cc per KG of saline resuscitation concerning underlying congestive heart failure and his hypoxia at this time.  Clinical Course  Comment By Time  Reassessed patient currently normotensive on NE drip. Satting well on mechanical ventilation. Merlyn Lot, MD 08/04 2153   11:17 PM  Repeat check patient normotensive at this time still in rapid drip. Oxygenating well. Post intubation film does show ET tube just above the carina. Will pulled back 1 cm. Patient transferred to ICU in critical condition.  ____________________________________________   FINAL CLINICAL IMPRESSION(S) / ED DIAGNOSES  Final diagnoses:  Severe sepsis ([place])  Acute respiratory failure with hypoxemia ([facility])  Tachypnea  UTI (lower urinary tract infection)  Decubitus ulcer  Aspiration pneumonia of both lower lobes due to regurgitated food ([place])  Acute congestive heart failure, unspecified congestive heart failure type ([place])      NEW MEDICATIONS STARTED DURING THIS VISIT:  New Prescriptions   No medications on file     Note:  This document was prepared using Dragon voice recognition software and may include unintentional dictation errors.    Merlyn Lot, MD 05/27/16 [phone]

## 2016-05-27 NOTE — Progress Notes (Signed)
Verbal order from Dr. Nigel Bridgeman to pull et tube back 2cm.  Et tube currently positioned at 25cm at lip.  Pulled back to 23cm at lip. Patient tolerated well.

## 2016-05-27 NOTE — ED Notes (Signed)
RN called ICU to call reports but mid report RN had to leave to manage a pt. ICU RN reports she will call back to ED when available for report.

## 2016-05-27 NOTE — ED Notes (Signed)
Bear hugger applied 

## 2016-05-27 NOTE — ED Triage Notes (Signed)
Pt arrived to ED from [facility] after staff reports they found pt "not looking right: after dinner. Staff reports they believe he aspirated during dinner. Lung sounds wet upon arrival. Staff reports pt was 60% RA. Staff brought pt up to 68% on 4L. Pt placed on CPAP by EMS and was 93% upon arrival. Pt is a DNR and paperwork at bedside. Pt has also confirmed with MD that he is a DNR DNI and wishes to remain as such.

## 2016-05-28 ENCOUNTER — Inpatient Hospital Stay: Payer: Medicare Other

## 2016-05-28 ENCOUNTER — Inpatient Hospital Stay
Admit: 2016-05-28 | Discharge: 2016-05-28 | Disposition: A | Discharge status: Home / Self Care | Payer: Medicare Other | Attending: Adult Health | Admitting: Adult Health

## 2016-05-28 LAB — BLOOD GAS, ARTERIAL
Acid-Base Excess: 4.6 mmol/L — ABNORMAL HIGH (ref 0.0–3.0)
Acid-Base Excess: 1.7 mmol/L (ref 0.0–3.0)
BICARBONATE: 26.6 meq/L (ref 21.0–28.0)
Bicarbonate: 30.3 mEq/L — ABNORMAL HIGH (ref 21.0–28.0)
FIO2: 0.5
FIO2: 1
MECHANICAL RATE: 12
MECHANICAL RATE: 12
O2 SAT: 97.1 %
O2 Saturation: 99.9 %
PATIENT TEMPERATURE: 37
PATIENT TEMPERATURE: 37
PCO2 ART: 50 mmHg — AB (ref 32.0–48.0)
PEEP: 5 cmH2O
PEEP: 8 cmH2O
PH ART: 7.39 (ref 7.350–7.450)
PO2 ART: 93 mmHg (ref 83.0–108.0)
PO2 ART: 281 mmHg — AB (ref 83.0–108.0)
VT: 500 mL
VT: 500 mL
pCO2 arterial: 42 mmHg (ref 32.0–48.0)
pH, Arterial: 7.41 (ref 7.350–7.450)

## 2016-05-28 LAB — MAGNESIUM
MAGNESIUM: 2 mg/dL (ref 1.7–2.4)
Magnesium: 2.1 mg/dL (ref 1.7–2.4)

## 2016-05-28 LAB — GLUCOSE, CAPILLARY
GLUCOSE-CAPILLARY: 115 mg/dL — AB (ref 65–99)
GLUCOSE-CAPILLARY: 119 mg/dL — AB (ref 65–99)
GLUCOSE-CAPILLARY: 134 mg/dL — AB (ref 65–99)
GLUCOSE-CAPILLARY: 144 mg/dL — AB (ref 65–99)
GLUCOSE-CAPILLARY: 86 mg/dL (ref 65–99)
Glucose-Capillary: 132 mg/dL — ABNORMAL HIGH (ref 65–99)
Glucose-Capillary: 98 mg/dL (ref 65–99)

## 2016-05-28 LAB — CBC
HCT: 28.6 % — ABNORMAL LOW (ref 40.0–52.0)
HEMOGLOBIN: 9.5 g/dL — AB (ref 13.0–18.0)
MCH: 27.1 pg (ref 26.0–34.0)
MCHC: 33.1 g/dL (ref 32.0–36.0)
MCV: 81.9 fL (ref 80.0–100.0)
PLATELETS: 300 10*3/uL (ref 150–440)
RBC: 3.5 MIL/uL — AB (ref 4.40–5.90)
RDW: 19 % — ABNORMAL HIGH (ref 11.5–14.5)
WBC: 22.2 10*3/uL — AB (ref 3.8–10.6)

## 2016-05-28 LAB — BASIC METABOLIC PANEL
Anion gap: 9 (ref 5–15)
BUN: 42 mg/dL — AB (ref 6–20)
CO2: 26 mmol/L (ref 22–32)
Calcium: 9.5 mg/dL (ref 8.9–10.3)
Chloride: 95 mmol/L — ABNORMAL LOW (ref 101–111)
Creatinine, Ser: 0.76 mg/dL (ref 0.61–1.24)
GLUCOSE: 136 mg/dL — AB (ref 65–99)
POTASSIUM: 5 mmol/L (ref 3.5–5.1)
SODIUM: 130 mmol/L — AB (ref 135–145)

## 2016-05-28 LAB — LACTIC ACID, PLASMA: LACTIC ACID, VENOUS: 2.1 mmol/L — AB (ref 0.5–1.9)

## 2016-05-28 LAB — TROPONIN I
TROPONIN I: 0.03 ng/mL — AB (ref <0.03)
Troponin I: 0.03 ng/mL (ref <0.03)

## 2016-05-28 LAB — PROCALCITONIN: PROCALCITONIN: 2.83 ng/mL

## 2016-05-28 LAB — VANCOMYCIN, TROUGH
Vancomycin Tr: 35 ug/mL (ref 15–20)
Vancomycin Tr: 30 ug/mL (ref 15–20)

## 2016-05-28 LAB — PHOSPHORUS
PHOSPHORUS: 6.7 mg/dL — AB (ref 2.5–4.6)
Phosphorus: 6.5 mg/dL — ABNORMAL HIGH (ref 2.5–4.6)

## 2016-05-28 LAB — DIGOXIN LEVEL

## 2016-05-28 LAB — MRSA PCR SCREENING: MRSA by PCR: POSITIVE — AB

## 2016-05-28 MED ORDER — DIGOXIN 125 MCG PO TABS
0.1250 mg | ORAL_TABLET | Freq: Every day | ORAL | Status: DC
  Administered 2016-05-29 – 2016-05-30 (×2): 0.125 mg
  Filled 2016-05-28 (×2): qty 1

## 2016-05-28 MED ORDER — APIXABAN 5 MG PO TABS
5.0000 mg | ORAL_TABLET | Freq: Two times a day (BID) | ORAL | Status: DC
  Administered 2016-05-28 – 2016-05-30 (×4): 5 mg
  Filled 2016-05-28 (×4): qty 1

## 2016-05-28 MED ORDER — MUPIROCIN 2 % EX OINT
1.0000 "application " | TOPICAL_OINTMENT | Freq: Two times a day (BID) | CUTANEOUS | Status: AC
  Administered 2016-05-28 – 2016-06-01 (×9): 1 via NASAL
  Filled 2016-05-28: qty 22

## 2016-05-28 MED ORDER — ALBUTEROL SULFATE (2.5 MG/3ML) 0.083% IN NEBU: 2.5000 mg | INHALATION_SOLUTION | RESPIRATORY_TRACT | Status: DC | PRN

## 2016-05-28 MED ORDER — ANTISEPTIC ORAL RINSE SOLUTION (CORINZ)
7.0000 mL | OROMUCOSAL | Status: DC
  Administered 2016-05-28 – 2016-05-30 (×25): 7 mL via OROMUCOSAL
  Filled 2016-05-28 (×29): qty 7

## 2016-05-28 MED ORDER — CHLORHEXIDINE GLUCONATE 0.12% ORAL RINSE (MEDLINE KIT)
15.0000 mL | Freq: Two times a day (BID) | OROMUCOSAL | Status: DC
  Administered 2016-05-28 – 2016-05-30 (×6): 15 mL via OROMUCOSAL
  Filled 2016-05-28 (×7): qty 15

## 2016-05-28 MED ORDER — CEFEPIME HCL 2 G IJ SOLR
2.0000 g | Freq: Three times a day (TID) | INTRAMUSCULAR | Status: DC
  Administered 2016-05-28 – 2016-06-01 (×13): 2 g via INTRAVENOUS
  Filled 2016-05-28 (×15): qty 2

## 2016-05-28 MED ORDER — VITAL HIGH PROTEIN PO LIQD
1000.0000 mL | ORAL | Status: DC
  Administered 2016-05-29: 1000 mL
  Administered 2016-05-29 – 2016-05-30 (×9)
  Administered 2016-05-30: 1000 mL
  Administered 2016-05-30 (×4)

## 2016-05-28 MED ORDER — NOREPINEPHRINE BITARTRATE 1 MG/ML IV SOLN
0.0000 ug/min | INTRAVENOUS | Status: AC
  Administered 2016-05-28: 8 ug/min via INTRAVENOUS
  Administered 2016-05-29: 10 ug/min via INTRAVENOUS
  Filled 2016-05-28 (×2): qty 16

## 2016-05-28 MED ORDER — ATORVASTATIN CALCIUM 10 MG PO TABS
10.0000 mg | ORAL_TABLET | Freq: Every day | ORAL | Status: DC
  Administered 2016-05-28 – 2016-05-29 (×2): 10 mg
  Filled 2016-05-28 (×2): qty 1

## 2016-05-28 MED ORDER — FUROSEMIDE 10 MG/ML IJ SOLN
20.0000 mg | Freq: Once | INTRAMUSCULAR | Status: AC
  Administered 2016-05-28: 20 mg via INTRAVENOUS
  Filled 2016-05-28: qty 2

## 2016-05-28 MED ORDER — CHLORHEXIDINE GLUCONATE CLOTH 2 % EX PADS
6.0000 | MEDICATED_PAD | Freq: Every day | CUTANEOUS | Status: AC
  Administered 2016-05-28 – 2016-06-01 (×5): 6 via TOPICAL

## 2016-05-28 NOTE — Progress Notes (Signed)
Alerted NP that pt.'s UOP had been low since admission, and for 0300-0400 UOP was zero. NP will wait to review AM kidney function labs, then decide btwn fluid resuscitation/lasix.

## 2016-05-28 NOTE — Progress Notes (Signed)
Initial Nutrition Assessment     INTERVENTION:  -If unable to extubate within next 24-48 hr recommend starting enteral nutrition   NUTRITION DIAGNOSIS:   Inadequate oral intake related to acute illness as evidenced by NPO status.    GOAL:   Provide needs based on ASPEN/SCCM guidelines    MONITOR:   Vent status  REASON FOR ASSESSMENT:   Consult Assessment of nutrition requirement/status  ASSESSMENT:      Pt admitted with AMS, possible aspiration pneumonia, currently intubated. Pt also with sepsis from infected decub ulcers and pneumonia  Past Medical History:  Diagnosis Date  . A-fib ([place])   . Cancer ([place])   . Diabetes mellitus without complication ([place])   . MI (myocardial infarction) ([place])    Medications reviewed: aspart, levophed Labs reviewed: Na 130, BUN 42, creatinine WDL, phosphorus 6.7, Mag 2.1, glucose 136  OG tube in place  Diet Order:     Skin:   (stage III and unstageable pressure ulcer noted on sacrum and buttocks)  Last BM:  PTA, noted abdomen taut, firm  Height:   Ht Readings from Last 1 Encounters:  05/28/16 6' (1.829 m)    Weight: noted wt gain per wt encounters  Wt Readings from Last 1 Encounters:  05/28/16 195 lb 5.2 oz (88.6 kg)    Ideal Body Weight:     BMI:  Body mass index is 26.49 kg/m.  Estimated Nutritional Needs:   Kcal:  [id number] kcals/d (Ve 8, Tmax 37.5) Using 88kg)  Protein:  105-176 g/d  Fluid:  >/= 1857ml/d  EDUCATION NEEDS:   No education needs identified at this time  Taurean Ju B. Zenia Resides, [place], [place] (pager) Weekend/On-Call pager [phone])

## 2016-05-28 NOTE — Progress Notes (Signed)
Pt desatted to 75%. Bagged by RN.  Lavaged and then suctioned thick yellow sections. Order given to increase PEEP to 8. Pt O2 sat now 99%.

## 2016-05-28 NOTE — Progress Notes (Signed)
Pharmacy Antibiotic Note  Paul Mcmahon is a 82 y.o. male admitted on 05/27/2016 with pneumonia.  Pharmacy has been consulted for cefepime and vancomycin dosing.  Plan: 1. Cefepime 2 gm IV Q8H 2. Patient is on vancomycin outpatient. RN didn't know when last dose was administered. Will check a random level, then resume home dosing if appropriate.   [id number] vancomycin random level 30 mcg/mL. Unsure how much of the first dose patient received in ED. Tried to call nurse x 2, phone busy both times. Will recheck level at 0500 to inform dosing.   0805 0434 vancomycin level increased to 35 mcg/mL after receiving unspecified amount in ED. RN reported attaching the bag and shutting off almost immediately - negligible dose administered. Will recheck vancomycin level this evening.   Height: 6' (182.9 cm) Weight: 195 lb 5.2 oz (88.6 kg) IBW/kg (Calculated) : 77.6  Temp (24hrs), Avg:94.7 F (34.8 C), Min:93.3 F (34.1 C), Max:98.1 F (36.7 C)   Recent Labs Lab 05/24/16 0040 05/26/16 1050 05/27/16 0940 05/27/16 2102 05/27/16 2119 05/28/16 0050 05/28/16 0434  WBC  --   --  15.7* 15.7*  --   --  22.2*  CREATININE 0.31* 0.46* 0.48* 0.54*  --   --  0.76  LATICACIDVEN  --   --   --   --  1.6 2.1*  --   VANCOTROUGH 20 18  --   --   --   --  35*  VANCORANDOM  --   --   --  30  --   --   --     Estimated Creatinine Clearance: 68.7 mL/min (by C-G formula based on SCr of 0.8 mg/dL).    Allergies  Allergen Reactions  . Penicillins Other (See Comments)    Reaction: Welts and blisters all over body    Thank you for allowing pharmacy to be a part of this patient's care.  Laural Benes, Pharm.D., BCPS Clinical Pharmacist 05/28/2016 5:23 AM

## 2016-05-28 NOTE — Progress Notes (Signed)
Dr Alva Garnet informed urine output is very low.  Acknowledged, no new orders

## 2016-05-28 NOTE — Progress Notes (Signed)
Pharmacy Antibiotic Note  Paul Mcmahon is a 82 y.o. male admitted on 05/27/2016 with pneumonia.  Pharmacy has been consulted for cefepime and vancomycin dosing.  Patient was on outpatient vancomycin. Receiving 1500mg  IV every 24 hours.   In ED patient received unspecified amount of vancomycin.  0805 0434 vancomycin level  35 mcg/mL    0805 1716 Vanc random level 30 mcg/mL  Plan: 1.Continue  Cefepime 2 gm IV Q8H  2. Vancomycin level now at 85mcg/mL after 12 hours. Will redraw vancomycin level in AM. Plan to restart patient home dose when level with in range.     Height: 6' (182.9 cm) Weight: 195 lb 5.2 oz (88.6 kg) IBW/kg (Calculated) : 77.6  Temp (24hrs), Avg:96.7 F (35.9 C), Min:93.3 F (34.1 C), Max:99.7 F (37.6 C)   Recent Labs Lab 05/24/16 0040 05/26/16 1050 05/27/16 0940 05/27/16 2102 05/27/16 2119 05/28/16 0050 05/28/16 0434 05/28/16 1716  WBC  --   --  15.7* 15.7*  --   --  22.2*  --   CREATININE 0.31* 0.46* 0.48* 0.54*  --   --  0.76  --   LATICACIDVEN  --   --   --   --  1.6 2.1*  --   --   VANCOTROUGH 20 18  --   --   --   --  35* 30*  VANCORANDOM  --   --   --  30  --   --   --   --     Estimated Creatinine Clearance: 68.7 mL/min (by C-G formula based on SCr of 0.8 mg/dL).    Allergies  Allergen Reactions  . Penicillins Other (See Comments)    Reaction: Welts and blisters all over body    Thank you for allowing pharmacy to be a part of this patient's care.  Nancy Fetter, PharmD Clinical Pharmacist 05/28/2016 6:31 PM

## 2016-05-28 NOTE — Progress Notes (Signed)
Alerted NP to pt.'s high Phos level. Will continue to monitor pt. Closely.

## 2016-05-28 NOTE — Clinical Social Work Note (Signed)
Clinical Social Work Assessment  Patient Details  Name: Paul Mcmahon MRN: [id number] Date of Birth: 1925/01/14  Date of referral:  05/28/16               Reason for consult:  Facility Placement                Permission sought to share information with:  Family Supports Permission granted to share information::  Yes, Release of Information Signed  Name::      Ponce Gamarra [phone]), Haywood Pao [phone]), Leroy Kennedy [phone]))  Agency::     Relationship::     Contact Information:     Housing/Transportation Living arrangements for the past 2 months:  [place] of Information:  Adult Children, Medical Team Patient Interpreter Needed:  None Criminal Activity/Legal Involvement Pertinent to Current Situation/Hospitalization:  No - Comment as needed Significant Relationships:  Adult Children, Spouse Lives with:  Facility Resident Do you feel safe going back to the place where you live?  Yes Need for family participation in patient care:  Yes (Comment)  Care giving concerns:  Patient in sharp decline, referred to CSW due to admission from facility.   Social Worker assessment / plan:  Patient is intubated and sedated. Patient's spouse/decision maker verbally assented to discussion of care with patient's sons and reported that they would take lead on making decisions.   According to patient's medical team, patient is in sharp decline with a pressure wound that they suspect is causing sepsis. Dr. Alva Garnet reported that in the ED the patient consented to vent as a temporary measure, but indicated that he did not want aggressive measure pursued. Patient's family agreed with that statement. Dr. Alva Garnet advised the family on the treatment plan and danger of continuing vent care past 72 hours. Family members were able to verbalize treatment plan in their own terms. Patient's son, Sharief, asked about possible hospice involvement. Dr. Alva Garnet advised that such would  be discussed if progress made, but that the patient is not stable enough to d/c to hospice care at this point. Patient's family verbalized understanding.  Prior to admission, according to patient's family, patient was at [facility] for rehab. Patient is total care with extensive assistance with all ADLs and IADLs. Avory indicated that patient under "100 day Medicare umbrella" with an end to that on 8/30. CSW advised family that while patient is inpatient, the time stops and restarts at d/c to facility. Patient's family able to verbalize information in their own terms.  CSW will con't to follow in case d/c needs or emotional support needs arise. CSW advised family of contact info for weekend SW and primary SW.  Employment status:  Retired Insurance underwriter information:  Medicare PT Recommendations:  Not assessed at this time Information / Referral to community resources:     Patient/Family's Response to care: Patient intubated and sedated/Family members were appreciative and compliant with care plans.  Patient/Family's Understanding of and Emotional Response to Diagnosis, Current Treatment, and Prognosis:  Patient's family appropriately apprehensive about diagnosis and potential prognosis. Patient's family accepting of treatment plan and indicate no plan to pursue aggressive treatments, instead opting for comfort care if sepsis does not progress. Patient's family thanked CSW and Dr. Alva Garnet for time taken and emotional support.  Emotional Assessment Appearance:  Appears stated age Attitude/Demeanor/Rapport:   (Family concerned and apprehensive but involved with care. Patient intubated.) Affect (typically observed):   (Patients family present as afraid/apprehensive/tired. Patient intubated and sedated.) Orientation:   (Patient intubated  and sedated.) Alcohol / Substance use:  Never Used Psych involvement (Current and /or in the community):  No (Comment)  Discharge Needs  Concerns to be addressed:  Care  Coordination, Grief and Loss Concerns, Discharge Planning Concerns Readmission within the last 30 days:  Yes Current discharge risk:  Chronically ill Barriers to Discharge:  No Barriers Identified   Zettie Pho, LCSW 05/28/2016, 3:39 PM

## 2016-05-28 NOTE — Plan of Care (Signed)
Problem: Education: Goal: Knowledge of [place] Education information/materials will improve Outcome: Completed/Met Date Met: 05/28/16 sons  Problem: Health Behavior/Discharge Planning: Goal: Ability to manage health-related needs will improve Outcome: Completed/Met Date Met: 05/28/16 sons
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
## 2016-05-29 ENCOUNTER — Inpatient Hospital Stay: Payer: Medicare Other

## 2016-05-29 DIAGNOSIS — R6520 Severe sepsis without septic shock: Secondary | ICD-10-CM

## 2016-05-29 LAB — COMPREHENSIVE METABOLIC PANEL
ALBUMIN: 1.7 g/dL — AB (ref 3.5–5.0)
ALK PHOS: 80 U/L (ref 38–126)
ALT: 13 U/L — AB (ref 17–63)
ANION GAP: 8 (ref 5–15)
AST: 20 U/L (ref 15–41)
BILIRUBIN TOTAL: 0.7 mg/dL (ref 0.3–1.2)
BUN: 43 mg/dL — AB (ref 6–20)
CHLORIDE: 97 mmol/L — AB (ref 101–111)
CO2: 27 mmol/L (ref 22–32)
CREATININE: 0.78 mg/dL (ref 0.61–1.24)
Calcium: 9.5 mg/dL (ref 8.9–10.3)
GFR calc Af Amer: >60 mL/min (ref >60)
GFR calc non Af Amer: >60 mL/min (ref >60)
Glucose, Bld: 140 mg/dL — ABNORMAL HIGH (ref 65–99)
POTASSIUM: 4.6 mmol/L (ref 3.5–5.1)
SODIUM: 132 mmol/L — AB (ref 135–145)
Total Protein: 5.4 g/dL — ABNORMAL LOW (ref 6.5–8.1)

## 2016-05-29 LAB — GLUCOSE, CAPILLARY
GLUCOSE-CAPILLARY: 119 mg/dL — AB (ref 65–99)
GLUCOSE-CAPILLARY: 145 mg/dL — AB (ref 65–99)
Glucose-Capillary: 148 mg/dL — ABNORMAL HIGH (ref 65–99)
Glucose-Capillary: 118 mg/dL — ABNORMAL HIGH (ref 65–99)
Glucose-Capillary: 144 mg/dL — ABNORMAL HIGH (ref 65–99)

## 2016-05-29 LAB — VANCOMYCIN, RANDOM: VANCOMYCIN RM: 25

## 2016-05-29 LAB — CBC
HEMATOCRIT: 27.7 % — AB (ref 40.0–52.0)
HEMOGLOBIN: 8.9 g/dL — AB (ref 13.0–18.0)
MCH: 26.2 pg (ref 26.0–34.0)
MCHC: 32.2 g/dL (ref 32.0–36.0)
MCV: 81.6 fL (ref 80.0–100.0)
Platelets: 253 10*3/uL (ref 150–440)
RBC: 3.4 MIL/uL — AB (ref 4.40–5.90)
RDW: 19.1 % — ABNORMAL HIGH (ref 11.5–14.5)
WBC: 12.9 10*3/uL — ABNORMAL HIGH (ref 3.8–10.6)

## 2016-05-29 LAB — URINE CULTURE: Culture: >=100000 — AB

## 2016-05-29 LAB — ECHOCARDIOGRAM COMPLETE
Height: 72 in
WEIGHTICAEL: 3125.24 [oz_av]

## 2016-05-29 MED ORDER — NOREPINEPHRINE 4 MG/250ML-% IV SOLN
0.0000 ug/min | INTRAVENOUS | Status: DC
  Administered 2016-05-30: 4 ug/min via INTRAVENOUS
  Filled 2016-05-29: qty 250

## 2016-05-29 MED ORDER — VANCOMYCIN HCL IN DEXTROSE 1-5 GM/200ML-% IV SOLN
1000.0000 mg | INTRAVENOUS | Status: DC
  Administered 2016-05-30 – 2016-06-01 (×2): 1000 mg via INTRAVENOUS
  Filled 2016-05-29 (×2): qty 200

## 2016-05-29 MED ORDER — LACTULOSE 10 GM/15ML PO SOLN
30.0000 g | Freq: Once | ORAL | Status: AC
  Administered 2016-05-29: 30 g
  Filled 2016-05-29: qty 60

## 2016-05-29 MED ORDER — FLUCONAZOLE IN SODIUM CHLORIDE 200-0.9 MG/100ML-% IV SOLN
200.0000 mg | INTRAVENOUS | Status: DC
  Administered 2016-05-29 – 2016-06-01 (×4): 200 mg via INTRAVENOUS
  Filled 2016-05-29 (×4): qty 100

## 2016-05-29 NOTE — Plan of Care (Signed)
Problem: Pain Managment: Goal: General experience of comfort will improve Outcome: Progressing Patient would only intermittently follow commands during shift (cooperation peaked around midday). Sedation titrated to maintain RASS goal.   Problem: Skin Integrity: Goal: Risk for impaired skin integrity will decrease Outcome: Progressing Dressings had to be changed during beginning of shift report when saturated dressings would not stay on patient during skin check. Since then decreased drainage noted by this RN during the shift, dressings stayed in place throughout rest of shift. Patient only turned to sides and not left supine due sacral wounds.  Problem: Tissue Perfusion: Goal: Risk factors for ineffective tissue perfusion will decrease Outcome: Progressing Able to palpate right pedal pulse by end of shift when could only obtain through doppler at start of shift.  Problem: Fluid Volume: Goal: Ability to maintain a balanced intake and output will improve Outcome: Progressing No bowel movement during shift. Abdomen soft in most places but firm spot present around umbilicus. Tube feedings continued.

## 2016-05-29 NOTE — Progress Notes (Signed)
LCSW aware of consult and patient is from a facility. Patient resides at [facility].  Please see detailed consult note from [place] on 8/5.  CSW dept following for disposition of needs and resources.  Available if needs arise.  Currently patient is not medically stable at this time.  Lane Hacker, MSW Clinical Social Work: Printmaker

## 2016-05-29 NOTE — Progress Notes (Signed)
Son, Cruise, called for update. Son, Marden Noble, came in person in afternoon with patient's wife, Toku, to visit patient and receive update. Patient's wife pleasantly confused. When RN went in patient's room at 16:00, only patient's wife present. RN stayed with patient and patient's wife and called son Marden Noble at 16:30 (after checking waiting room and not finding him there) when he had not returned to patient's room and to patient's wife. Son, Marden Noble, stated on phone that he had only left for a short time and would return and was not "leaving her there". Son, Marden Noble, stated he was on his way back to the hospital. At 16:40 patient's wife left room asking where the bathroom was. RN escorted patient's wife to bathroom and back to patient's room. Son, Marden Noble, returned at 16:52. RN and Marden Noble discussed importance of not leaving patient's wife unattended in hospital and Marden Noble verbalized importance of that. Doug and patient's wife, Toku, left together soon after.

## 2016-05-29 NOTE — Plan of Care (Signed)
Problem: Pain Managment: Goal: General experience of comfort will improve Outcome: Progressing Fentanyl gtt running (see EMR for details).  Problem: Physical Regulation: Goal: Ability to maintain clinical measurements within normal limits will improve Outcome: Progressing Levo gtt still running, but able to titrate down minimally.  Problem: Skin Integrity: Goal: Risk for impaired skin integrity will decrease Outcome: Not Progressing Pt. Still unable to reposition self.  Problem: Nutrition: Goal: Adequate nutrition will be maintained Outcome: Progressing VHP started at 20 mL/h

## 2016-05-29 NOTE — Progress Notes (Signed)
Brief Nutrition Note  Consult received for enteral/tube feeding initiation and management.  Adult Enteral Nutrition trickle feeding (25ml/hr) Protocol initiated . Further recommendations regarding tube feeding to follow based on tolerance and poc.    Admitting Dx: Tachypnea [R06.82] UTI (lower urinary tract infection) [N39.0] Decubitus ulcer [L89.90] Encounter for intubation [Z01.818] Severe sepsis ([place]) [A41.9, R65.20] Acute respiratory failure with hypoxemia (HCC) [J96.01] Acute congestive heart failure, unspecified congestive heart failure type ([place]) [I50.9] Aspiration pneumonia of both lower lobes due to regurgitated food ([place]) [J69.0]  Body mass index is 25.95 kg/m.   Labs:   Recent Labs Lab 05/27/16 2102 05/28/16 0050 05/28/16 0434 05/29/16 0426  NA 130*  --  130* 132*  K 4.7  --  5.0 4.6  CL 95*  --  95* 97*  CO2 28  --  26 27  BUN 40*  --  42* 43*  CREATININE 0.54*  --  0.76 0.78  CALCIUM 9.6  --  9.5 9.5  MG  --  2.0 2.1  --   PHOS  --  6.5* 6.7*  --   GLUCOSE 52*  --  136* 140*    Paul Mcmahon B. Zenia Resides, [place], [place] (pager) Weekend/On-Call pager [phone])

## 2016-05-29 NOTE — Progress Notes (Signed)
Pharmacy Antibiotic Note  Paul Mcmahon is a 82 y.o. male admitted on 05/27/2016 with pneumonia. UA from 8/4 consistent with UTI. Ucx showing >100,000 colonies/mL yeast.  Pharmacy has been consulted for Candidiasis UTI dosing.  Plan: Will start patient on Fluconazole 200mg  IV every 24 hours.   Height: 6' (182.9 cm) Weight: 191 lb 5.8 oz (86.8 kg) IBW/kg (Calculated) : 77.6  Temp (24hrs), Avg:98.4 F (36.9 C), Min:97.5 F (36.4 C), Max:99.7 F (37.6 C)   Recent Labs Lab 05/26/16 1050 05/27/16 0940 05/27/16 2102 05/27/16 2119 05/28/16 0050 05/28/16 0434 05/28/16 1716 05/29/16 0426  WBC  --  15.7* 15.7*  --   --  22.2*  --  12.9*  CREATININE 0.46* 0.48* 0.54*  --   --  0.76  --  0.78  LATICACIDVEN  --   --   --  1.6 2.1*  --   --   --   VANCOTROUGH 18  --   --   --   --  35* 30*  --   VANCORANDOM  --   --  30  --   --   --   --  25    Estimated Creatinine Clearance: 68.7 mL/min (by C-G formula based on SCr of 0.8 mg/dL).    Allergies  Allergen Reactions  . Penicillins Other (See Comments)    Reaction: Welts and blisters all over body    Antimicrobials this admission: 8/6 Fluconazole >> 8/4 Cefepime >>  7/22 Vancomycin  >> was taking at nursing home prior to admission   Microbiology results: 8/4 BCx: NG x12 hours 8/4 UCx: >100,000 yeast  8/5 Wound Cx: Mod GNR, Few GPC 8/5 Respiratory: Rare GPC  8/5 MRSA PCR: positive   Thank you for allowing pharmacy to be a part of this patient's care.  Nancy Fetter, PharmD Clinical Pharmacist 05/29/2016 1:27 PM

## 2016-05-29 NOTE — Progress Notes (Signed)
Pharmacy Antibiotic Note  Paul Mcmahon is a 82 y.o. male admitted on 05/27/2016 with pneumonia.  Pharmacy has been consulted for cefepime and vancomycin dosing.  Patient was on outpatient vancomycin. Receiving 1500mg  IV every 24 hours.   In ED patient received unspecified amount of vancomycin.  0805 0434 vancomycin level  35 mcg/mL    0805 1716 Vanc random level 30 mcg/mL  Plan: 1.Continue  Cefepime 2 gm IV Q8H  2. Recalculated vancomycin Ke from 2 levels approximately 0.016 hr-1. Resume dosing tomorrow 05/30/16 at 0800 when predicted trough 15 mcg/mL. Schedule 1 gm IV Q48H, predicted trough 15 mcg/mL. Pharmacy will continue to follow and adjust as needed to maintain trough 15 to 20 mcg/mL.     Height: 6' (182.9 cm) Weight: 191 lb 5.8 oz (86.8 kg) IBW/kg (Calculated) : 77.6  Temp (24hrs), Avg:98.9 F (37.2 C), Min:97.7 F (36.5 C), Max:99.7 F (37.6 C)   Recent Labs Lab 05/26/16 1050 05/27/16 0940 05/27/16 2102 05/27/16 2119 05/28/16 0050 05/28/16 0434 05/28/16 1716 05/29/16 0426  WBC  --  15.7* 15.7*  --   --  22.2*  --  12.9*  CREATININE 0.46* 0.48* 0.54*  --   --  0.76  --  0.78  LATICACIDVEN  --   --   --  1.6 2.1*  --   --   --   VANCOTROUGH 18  --   --   --   --  35* 30*  --   VANCORANDOM  --   --  30  --   --   --   --  25    Estimated Creatinine Clearance: 68.7 mL/min (by C-G formula based on SCr of 0.8 mg/dL).    Allergies  Allergen Reactions  . Penicillins Other (See Comments)    Reaction: Welts and blisters all over body    Thank you for allowing pharmacy to be a part of this patient's care.  Nancy Fetter, PharmD Clinical Pharmacist 05/29/2016 6:53 AM

## 2016-05-29 NOTE — Progress Notes (Signed)
PULMONARY / CRITICAL CARE MEDICINE   Name: Paul Mcmahon MRN: [id number] DOB: 09-16-1925    ADMISSION DATE:  05/27/2016  REFERRING MD: Dr. Quentin Cornwall  CHIEF COMPLAINT:  AMS, hypoxia  PT PROFILE: 12 M SNF resident with severe chronic debilitation and multiple chronic medical problems intubated in ED on 08/04 early AM for acute hypoxic respiratory failure, AMS  MAJOR EVENTS/TEST RESULTS: 08/04 presented to ED in respiratory distress. Intubated 08/05 Admitted to [facility] service. Principal diagnoses of severe sepsis/septic shock due to infected decubitus ulcer and acute respiratory failure with bilateral pleural effusions. Made DNR after discussion with pt's sons 08/05 TTE: LVEF 60-65%, LA mildly dilated  INDWELLING DEVICES:: ETT 08/04 >>  LUE PICC (chronic) >>   MICRO DATA: MRSA PCR 08/05 >> POS Urine 08/04 >> greater than 100k yeast Blood 08/04 >>  Resp 08/05 >>  Wound 08/05 >>    ANTIMICROBIALS:  Vanc 08/04 >>  Cefepime 08/04 >>  Fluconazole 08/05 >>   SUBJECTIVE:  RASS -2, not F/C. Remains on norepinephrine  VITAL SIGNS: BP 106/62   Pulse 74   Temp 97.7 F (36.5 C)   Resp 13   Ht 6' (1.829 m)   Wt 191 lb 5.8 oz (86.8 kg)   SpO2 100%   BMI 25.95 kg/m   HEMODYNAMICS:    VENTILATOR SETTINGS: Vent Mode: PRVC FiO2 (%):  [30 %-35 %] 30 % Set Rate:  [12 bmp] 12 bmp Vt Set:  [500 mL] 500 mL PEEP:  [5 cmH20] 5 cmH20  INTAKE / OUTPUT: I/O last 3 completed shifts: In: 1953.5 [I.V.:1118.5; Other:200; NG/GT:285; IV Piggyback:350] Out: 2390 [Urine:2390]  PHYSICAL EXAMINATION: General: Chronically ill, NAD, RASS -2 Neuro: CNs intact, MAEs. DTRs symmetric HEENT: [place]/AT Cardiovascular: IRIR, mildly tachy, no M Lungs: bilateral coarse BS, no wheezes Abdomen: Non-distended, normal bowel sounds Extremities: trace symmetric edema Skin: 2 large stage 4 sacral ulcers with tunneling  LABS:  BMET  Recent Labs Lab 05/27/16 2102 05/28/16 0434 05/29/16 0426  NA 130*  130* 132*  K 4.7 5.0 4.6  CL 95* 95* 97*  CO2 28 26 27   BUN 40* 42* 43*  CREATININE 0.54* 0.76 0.78  GLUCOSE 52* 136* 140*    Electrolytes  Recent Labs Lab 05/27/16 2102 05/28/16 0050 05/28/16 0434 05/29/16 0426  CALCIUM 9.6  --  9.5 9.5  MG  --  2.0 2.1  --   PHOS  --  6.5* 6.7*  --     CBC  Recent Labs Lab 05/27/16 2102 05/28/16 0434 05/29/16 0426  WBC 15.7* 22.2* 12.9*  HGB 9.9* 9.5* 8.9*  HCT 30.0* 28.6* 27.7*  PLT 313 300 253    Coag's No results for input(s): APTT, INR in the last 168 hours.  Sepsis Markers  Recent Labs Lab 05/27/16 2119 05/28/16 0050  LATICACIDVEN 1.6 2.1*  PROCALCITON  --  2.83    ABG  Recent Labs Lab 05/27/16 2150 05/28/16 0353  PHART 7.39 7.41  PCO2ART 50* 42  PO2ART 93 281*    Liver Enzymes  Recent Labs Lab 05/27/16 0940 05/27/16 2102 05/29/16 0426  AST 24 27 20   ALT 13* 15* 13*  ALKPHOS 104 114 80  BILITOT 0.3 0.5 0.7  ALBUMIN 1.7* 1.8* 1.7*    Cardiac Enzymes  Recent Labs Lab 05/27/16 2102 05/28/16 0050 05/28/16 0434  TROPONINI 0.03* <0.03 0.03*    Glucose  Recent Labs Lab 05/28/16 1118 05/28/16 1618 05/28/16 1936 05/28/16 2334 05/29/16 0347 05/29/16 0833  GLUCAP 134* 132* 119* 86  119* 145*    CXR: R>L pleural effusions   ASSESSMENT / PLAN:  PULMONARY A: Acute hypoxic respiratory failure Possible HCAP Bilateral effusions P:   Cont full vent support - settings reviewed and/or adjusted Cont vent bundle Daily SBT if/when meets criteria PRN nebulized albuterol  CARDIOVASCULAR A:  CAF CAD Septic shock P:  Wean NE off for MAP > 65 mmHg  RENAL A:   Mild hyponatremia Hypervolemia P:   Monitor BMET intermittently Monitor I/Os Correct electrolytes as indicated  GASTROINTESTINAL A:   No acute issues P:   SUP: IV famotidine Cont TFs  HEMATOLOGIC A:   Chronic anemia P:  DVT px: apixaban for chronic AF Monitor CBC intermittently Transfuse per usual  guidelines  INFECTIOUS A:   Severe sepsis Septic sacral decub ulcers Possible HCAP Yeast UTI P:   Monitor temp, WBC count Micro and abx as above  ENDOCRINE A:   DM 2  P:   Cont SSI protocol  NEUROLOGIC A:   Acute encephalopathy P:   RASS goal: -1, -2 PAD protocol  Disposition and family update: See note from 08/05. No family @ bedside 08/06   CCM time: 45 mins The above time includes time spent in consultation with patient and/or family members and reviewing care plan on multidisciplinary rounds  Merton Border, MD PCCM service Mobile [phone] Pager [phone]   05/29/2016

## 2016-05-30 ENCOUNTER — Inpatient Hospital Stay: Payer: Medicare Other

## 2016-05-30 DIAGNOSIS — L8990 Pressure ulcer of unspecified site, unspecified stage: Secondary | ICD-10-CM

## 2016-05-30 DIAGNOSIS — I509 Heart failure, unspecified: Secondary | ICD-10-CM

## 2016-05-30 DIAGNOSIS — J9600 Acute respiratory failure, unspecified whether with hypoxia or hypercapnia: Secondary | ICD-10-CM

## 2016-05-30 LAB — BASIC METABOLIC PANEL
ANION GAP: 5 (ref 5–15)
BUN: 45 mg/dL — ABNORMAL HIGH (ref 6–20)
CHLORIDE: 98 mmol/L — AB (ref 101–111)
CO2: 28 mmol/L (ref 22–32)
Calcium: 9.2 mg/dL (ref 8.9–10.3)
Creatinine, Ser: 0.69 mg/dL (ref 0.61–1.24)
GFR calc Af Amer: >60 mL/min (ref >60)
GLUCOSE: 115 mg/dL — AB (ref 65–99)
POTASSIUM: 4.3 mmol/L (ref 3.5–5.1)
SODIUM: 131 mmol/L — AB (ref 135–145)

## 2016-05-30 LAB — CBC
HCT: 26.1 % — ABNORMAL LOW (ref 40.0–52.0)
HEMOGLOBIN: 8.6 g/dL — AB (ref 13.0–18.0)
MCH: 26.5 pg (ref 26.0–34.0)
MCHC: 32.8 g/dL (ref 32.0–36.0)
MCV: 80.8 fL (ref 80.0–100.0)
PLATELETS: 214 10*3/uL (ref 150–440)
RBC: 3.23 MIL/uL — AB (ref 4.40–5.90)
RDW: 19.1 % — ABNORMAL HIGH (ref 11.5–14.5)
WBC: 10.1 10*3/uL (ref 3.8–10.6)

## 2016-05-30 LAB — CULTURE, RESPIRATORY W GRAM STAIN

## 2016-05-30 LAB — AEROBIC CULTURE W GRAM STAIN (SUPERFICIAL SPECIMEN)

## 2016-05-30 LAB — GLUCOSE, CAPILLARY
GLUCOSE-CAPILLARY: 119 mg/dL — AB (ref 65–99)
GLUCOSE-CAPILLARY: 150 mg/dL — AB (ref 65–99)
GLUCOSE-CAPILLARY: 152 mg/dL — AB (ref 65–99)
GLUCOSE-CAPILLARY: 143 mg/dL — AB (ref 65–99)
Glucose-Capillary: 121 mg/dL — ABNORMAL HIGH (ref 65–99)
Glucose-Capillary: 158 mg/dL — ABNORMAL HIGH (ref 65–99)

## 2016-05-30 LAB — AEROBIC CULTURE  (SUPERFICIAL SPECIMEN)

## 2016-05-30 LAB — CULTURE, RESPIRATORY: CULTURE: NORMAL

## 2016-05-30 LAB — PHOSPHORUS: Phosphorus: 4.5 mg/dL (ref 2.5–4.6)

## 2016-05-30 LAB — MAGNESIUM: MAGNESIUM: 2.3 mg/dL (ref 1.7–2.4)

## 2016-05-30 MED ORDER — HYDROGEL GEL
1.0000 "application " | Freq: Every day | Status: DC
  Administered 2016-05-31 – 2016-06-01 (×2): 1 via TOPICAL
  Filled 2016-05-30 (×3): qty 1

## 2016-05-30 MED ORDER — NON FORMULARY: 1.0000 "application " | Freq: Every day | Status: DC

## 2016-05-30 MED ORDER — CETYLPYRIDINIUM CHLORIDE 0.05 % MT LIQD
7.0000 mL | Freq: Two times a day (BID) | OROMUCOSAL | Status: DC
  Administered 2016-05-30 – 2016-06-02 (×6): 7 mL via OROMUCOSAL

## 2016-05-30 MED ORDER — CHLORHEXIDINE GLUCONATE 0.12 % MT SOLN
15.0000 mL | Freq: Two times a day (BID) | OROMUCOSAL | Status: DC
  Administered 2016-05-30 – 2016-06-02 (×5): 15 mL via OROMUCOSAL
  Filled 2016-05-30 (×2): qty 15

## 2016-05-30 NOTE — Clinical Social Work Note (Signed)
CSW wanted to clarify that patient is from [place] [facility] the [place]. CSW to complete FL2 and send for signature today. Shela Leff MSW,LCSW [phone]

## 2016-05-30 NOTE — Progress Notes (Signed)
Please note patient is currently followed by PALLIATIVE medicine NP at [facility]. [place] and CSW Shela Leff advised. In hospital Palliative Medicine consult has been requested. Thank you. Flo Shanks RN, BSN, [facility] [place], hospital Liaison [phone] c

## 2016-05-30 NOTE — Progress Notes (Signed)
  Patient extubated successfully this afternoon, family at bedside, I have addressed CODE STATUS again with patient and family. Patient DOES NOT want to be re-intubated. Patient and family have agreed for DNR/DNI status.   Corrin Parker, M.D.  [facility]  Medical Director Post Director [facility] Cardio-Pulmonary Department

## 2016-05-30 NOTE — Progress Notes (Signed)
PULMONARY / CRITICAL CARE MEDICINE   Name: Paul Mcmahon MRN: [id number] DOB: 1925-07-13    ADMISSION DATE:  05/27/2016  REFERRING MD: Dr. Quentin Cornwall  CHIEF COMPLAINT:  AMS, hypoxia  PT PROFILE: 6 M SNF resident with severe chronic debilitation and multiple chronic medical problems intubated in ED on 08/04 early AM for acute hypoxic respiratory failure, AMS  MAJOR EVENTS/TEST RESULTS: 08/04 presented to ED in respiratory distress. Intubated 08/05 Admitted to [facility] service. Principal diagnoses of severe sepsis/septic shock due to infected decubitus ulcer and acute respiratory failure with bilateral pleural effusions. Made DNR after discussion with pt's sons 08/05 TTE: LVEF 60-65%, LA mildly dilated  INDWELLING DEVICES:: ETT 08/04 >>  LUE PICC (chronic) >>   MICRO DATA: MRSA PCR 08/05 >> POS Urine 08/04 >> greater than 100k yeast Blood 08/04 >>  Resp 08/05 >>  Wound 08/05 >>    ANTIMICROBIALS:  Vanc 08/04 >>  Cefepime 08/04 >>  Fluconazole 08/05 >>   SUBJECTIVE:   No acute issues overnight. Maintained on full vent support. Had a medium BM. RASS -2, not F/C. Remains on norepinephrine AT 47mcg.   VITAL SIGNS: BP 100/88   Pulse 78   Temp 97.9 F (36.6 C)   Resp 19   Ht 6' (1.829 m)   Wt 181 lb 3.5 oz (82.2 kg)   SpO2 94%   BMI 24.58 kg/m   HEMODYNAMICS:    VENTILATOR SETTINGS: Vent Mode: PRVC FiO2 (%):  [30 %] 30 % Set Rate:  [12 bmp] 12 bmp Vt Set:  [500 mL] 500 mL PEEP:  [5 cmH20] 5 cmH20  INTAKE / OUTPUT: I/O last 3 completed shifts: In: 2234.6 [I.V.:989.6; Other:200; NG/GT:595; IV Piggyback:450] Out: [id number]  PHYSICAL EXAMINATION: General: Chronically ill, NAD, RASS -2 Neuro: CNs intact, MAEs. DTRs symmetric HEENT: [place]/AT, pupils pinpoint, sluggish Cardiovascular: IRIR, S1/S2, no MRG Lungs: bilateral coarse BS, no wheezes Abdomen: Non-distended, normal bowel sounds Extremities: trace symmetric edema Skin: 2 large stage 4 sacral ulcers with  tunneling  LABS:  BMET  Recent Labs Lab 05/28/16 0434 05/29/16 0426 05/30/16 0438  NA 130* 132* 131*  K 5.0 4.6 4.3  CL 95* 97* 98*  CO2 26 27 28   BUN 42* 43* 45*  CREATININE 0.76 0.78 0.69  GLUCOSE 136* 140* 115*    Electrolytes  Recent Labs Lab 05/28/16 0050 05/28/16 0434 05/29/16 0426 05/30/16 0438  CALCIUM  --  9.5 9.5 9.2  MG 2.0 2.1  --  2.3  PHOS 6.5* 6.7*  --  4.5    CBC  Recent Labs Lab 05/28/16 0434 05/29/16 0426 05/30/16 0438  WBC 22.2* 12.9* 10.1  HGB 9.5* 8.9* 8.6*  HCT 28.6* 27.7* 26.1*  PLT 300 253 214    Coag's No results for input(s): APTT, INR in the last 168 hours.  Sepsis Markers  Recent Labs Lab 05/27/16 2119 05/28/16 0050  LATICACIDVEN 1.6 2.1*  PROCALCITON  --  2.83    ABG  Recent Labs Lab 05/27/16 2150 05/28/16 0353  PHART 7.39 7.41  PCO2ART 50* 42  PO2ART 93 281*    Liver Enzymes  Recent Labs Lab 05/27/16 0940 05/27/16 2102 05/29/16 0426  AST 24 27 20   ALT 13* 15* 13*  ALKPHOS 104 114 80  BILITOT 0.3 0.5 0.7  ALBUMIN 1.7* 1.8* 1.7*    Cardiac Enzymes  Recent Labs Lab 05/27/16 2102 05/28/16 0050 05/28/16 0434  TROPONINI 0.03* <0.03 0.03*    Glucose  Recent Labs Lab 05/29/16 0833 05/29/16 1133  05/29/16 1621 05/29/16 1953 05/30/16 0007 05/30/16 0424  GLUCAP 145* 148* 118* 144* 158* 121*    Dg Chest Port 1 View  Result Date: 05/29/2016 CLINICAL DATA:  Respiratory failure EXAM: PORTABLE CHEST 1 VIEW COMPARISON:  May 28, 2016 FINDINGS: Stable support apparatus. No pneumothorax. Bilateral layering effusions, right greater than left with underlying atelectasis. Probable asymmetric edema, right greater than left. IMPRESSION: 1. No interval change in support apparatus. 2. Right greater than left layering effusions are stable. 3. Mild asymmetric edema not excluded. Electronically Signed   By: Dorise Bullion III M.D   On: 05/29/2016 08:07   ASSESSMENT / PLAN:  PULMONARY A: Acute hypoxic  respiratory failure Possible HCAP Bilateral pleural effusions P:   Cont full vent support - settings reviewed and/or adjusted Cont vent bundle Daily SBT if/when meets criteria PRN nebulized albuterol  CARDIOVASCULAR A:  Chronic Afib-rate controlled CAD Septic shock-imrpoving P:  Hemodynamic monitoring per ICU protocol Wean  Off levophed as tolerated for MAP > 65 mmHg  RENAL A:   Mild hyponatremia Hypervolemia P:   Monitor BMET intermittently Monitor I/Os Correct electrolytes as indicated  GASTROINTESTINAL A:   No acute issues P:   SUP: IV famotidine Cont TFs  HEMATOLOGIC A:   Chronic anemia P:  DVT px: apixaban for chronic AF Monitor CBC intermittently Transfuse per usual guidelines  INFECTIOUS A:   Severe sepsis Septic sacral decub ulcers Possible HCAP Yeast UTI P:   Monitor temp, WBC count Micro and abx as above  ENDOCRINE A:   Type 2 DM P:   Cont SSI protocol  NEUROLOGIC A:   Acute encephalopathy secondary to sepsis and hypoxia P:   RASS goal: -1, -2 PAD protocol  Disposition and family update: No family at bedside. Will update once available. Based on Dr. Alva Garnet' discussion with patient's son, plan is for a one way extubation on 8/7 or 8/8 if no improvement.  Best Practice: Code Status: DNR but DNI Diet:NPO GI prophylaxis: H2 bl;ocker VTE prophylaxis:  SCD's / already on apixaban   [facility] ANP-BC Pulmonary and [place] Pager [phone] or [phone]   05/30/2016   STAFF NOTE: I, Dr. Corrin Parker,  have personally reviewed patient's available data, including medical history, events of note, physical examination and test results as part of my evaluation. I have discussed with NP and other care providers such as pharmacist, RN and RRT.    I have personally reviewed/obtained a history, examined the patient, evaluated Pertinent laboratory and [employer]/imaging results, and   formulated the assessment and plan   The Patient requires high complexity decision making for assessment and support, frequent evaluation and titration of therapies, application of advanced monitoring technologies and extensive interpretation of multiple databases. Critical Care Time devoted to patient care services described in this note is 40 minutes.  This Critical care time does not reflrect procedure time or supervisory time of NP but could involve care discussion time Overall, patient is critically ill, prognosis is guarded.  Patient with Multiorgan failure and at high risk for cardiac arrest and death.   Await for family to arrive to  Discuss extubation today   Corrin Parker, M.D.  [facility]  Medical Director [place] Director [facility] Cardio-Pulmonary Department

## 2016-05-30 NOTE — Care Management (Addendum)
Notified by CSW that patient is followed by palliative at [facility] - palliative consult requested from RN.

## 2016-05-30 NOTE — Progress Notes (Signed)
Pt. Was extubated at 64 per Dr. Zoila Shutter orders. He was suctioned prior to extubation for a small amount of thick white secretions. After extubation no wheezes or stridor was heard.

## 2016-05-30 NOTE — Consult Note (Signed)
[place] Nurse wound consult note Reason for Consult: 2 pressure injuries. #1 sacral unstageable.  #2 left upper, posterior, thigh/scrotum  Wound type:  #1 pressure injury unstageable #2 pressure injury stage 4 #3 left heel stage 2 partial thickness pressure injury #4 right medial great toe pressure injury, healing stage 2  Pressure Ulcer POA: Yes  Measurement: #1 8x4x2 #2 10x4x4 #3 1x1.5x0.1 #4 1x 0.5 x 0  Wound bed: #1 40% necrotic tissue, 60% pale smooth tissue #2 25% yellow, loosely adherent slough, 75% beefy red with bone visible #3 100% pale, pink tissue #4 100% pearly white tissue  Drainage (amount, consistency, odor)  #1 scant drainage, serosanguinous, no odor #2 copious purulent tan drainage with moderate amount of sanguinous drainage, foul odor #3 scant serous crusty drainage, no odor #4 no drainage, no odor  Periwound: #1 macerated, red, blanchable skin, edema, induration #2 red, blanchable with some friable periwound skin #3 intact, blanchable #4 intact, blanchable  Dressing procedure/placement/frequency: #1 sacral unstageable pressure injury. Wash with soap and water or wound cleanser.  Apply small amount of intrasite hydrogel to necrotic tissue and cover with silicon bordered foam dressing.  Change every 3 days and prn soilage. #2 left upper, posterior thigh/scrotum stage 4 pressure injury. Wash with NS. Apply small amount of intrasite gel to wound base with gauze.  Pack with NS moistened  kerlix, one piece cut to proper length to loosely pack dead space to surface. Cover with silicon bordered foam or ABD and paper tape. Change daily. #3 left heel stage 2 pressure injury. Wash with soap and water.  Cover with foam dressing every three days. Use pressure relieving boots. #4 right medial great toe pressure injury stage 2. Wash with soap and water.  Cover with foam dressing every three days. Use pressure relieving boots.  Discussed POC with bedside nurse. If  thigh/scrotum wound is draining and requiring more frequent dressings than once daily, please contact wound care.  Will follow up in one week.  Thanks Dorna Bloom BSN, RN, [facility]

## 2016-05-30 NOTE — Progress Notes (Signed)
RN swallow study preformed on patient at bedside. Patient is alert and oriented x3 post extubation, and able to follow commands. HOB approximately 45 degrees. Lungs bilaterally diminished. Patient given one sip of water. Patient started to cough and had to clear throat. No more water was given, oxygen saturations remained above 90% on RA. ST consulted. Wilnette Kales

## 2016-05-30 NOTE — Progress Notes (Signed)
Chaplain rounded the unit to provide a compassionate presence and support to the patient. The patient appeared to be sleeping and silent prayer was said.  Chaplain Baer [place] [phone] 

## 2016-05-30 NOTE — Progress Notes (Signed)
Per Dr. Mortimer Fries d/c insulin order, but continue to monitor cbg. Orders placed. Wilnette Kales

## 2016-05-30 NOTE — Progress Notes (Signed)
Brief Nutrition Note:   Dietitian Consult received for initiation and management of TF; Adult Tube Feeding protocol ordered yesterday. Pt extubated today, TF held for extubation. NPO. Further assessment to follow  Kerman Passey [place], [place], [place] [phone] Pager  [phone] Weekend/On-Call Pager

## 2016-05-31 DIAGNOSIS — I5021 Acute systolic (congestive) heart failure: Secondary | ICD-10-CM

## 2016-05-31 LAB — BASIC METABOLIC PANEL
ANION GAP: 9 (ref 5–15)
BUN: 35 mg/dL — ABNORMAL HIGH (ref 6–20)
CALCIUM: 9.4 mg/dL (ref 8.9–10.3)
CO2: 26 mmol/L (ref 22–32)
CREATININE: 0.7 mg/dL (ref 0.61–1.24)
Chloride: 97 mmol/L — ABNORMAL LOW (ref 101–111)
GLUCOSE: 170 mg/dL — AB (ref 65–99)
Potassium: 3.5 mmol/L (ref 3.5–5.1)
Sodium: 132 mmol/L — ABNORMAL LOW (ref 135–145)

## 2016-05-31 LAB — MAGNESIUM: Magnesium: 2 mg/dL (ref 1.7–2.4)

## 2016-05-31 LAB — CREATININE, SERUM
Creatinine, Ser: 0.69 mg/dL (ref 0.61–1.24)
GFR calc non Af Amer: >60 mL/min (ref >60)

## 2016-05-31 LAB — GLUCOSE, CAPILLARY
GLUCOSE-CAPILLARY: 155 mg/dL — AB (ref 65–99)
GLUCOSE-CAPILLARY: 166 mg/dL — AB (ref 65–99)
GLUCOSE-CAPILLARY: 172 mg/dL — AB (ref 65–99)
GLUCOSE-CAPILLARY: 336 mg/dL — AB (ref 65–99)
Glucose-Capillary: 243 mg/dL — ABNORMAL HIGH (ref 65–99)

## 2016-05-31 LAB — CBC
HCT: 25.8 % — ABNORMAL LOW (ref 40.0–52.0)
HEMOGLOBIN: 8.4 g/dL — AB (ref 13.0–18.0)
MCH: 26.7 pg (ref 26.0–34.0)
MCHC: 32.6 g/dL (ref 32.0–36.0)
MCV: 81.9 fL (ref 80.0–100.0)
Platelets: 160 10*3/uL (ref 150–440)
RBC: 3.14 MIL/uL — AB (ref 4.40–5.90)
RDW: 19.1 % — ABNORMAL HIGH (ref 11.5–14.5)
WBC: 8.1 10*3/uL (ref 3.8–10.6)

## 2016-05-31 LAB — PHOSPHORUS: PHOSPHORUS: 3.2 mg/dL (ref 2.5–4.6)

## 2016-05-31 MED ORDER — INSULIN ASPART 100 UNIT/ML ~~LOC~~ SOLN
12.0000 [IU] | Freq: Once | SUBCUTANEOUS | Status: AC
  Administered 2016-05-31: 22:00:00 12 [IU] via SUBCUTANEOUS
  Filled 2016-05-31: qty 12

## 2016-05-31 NOTE — Progress Notes (Signed)
Initial Nutrition Assessment  DOCUMENTATION CODES:   Non-severe (moderate) malnutrition in context of chronic illness  INTERVENTION:   -Await diet progression -Recommend addition of Ensure or equivalent once diet advanced   NUTRITION DIAGNOSIS:   Inadequate oral intake related to acute illness as evidenced by NPO status.   Continues but being addressed as SLP to evaluate for diet advancement  GOAL:   Patient will meet greater than or equal to 90% of their needs  MONITOR:   Diet advancement, Supplement acceptance, Labs, Weight trends  REASON FOR ASSESSMENT:   Consult Assessment of nutrition requirement/status  ASSESSMENT:    82 yo male with severe chronic debilitation and multiple chronic medical problems admitted with acute respiratory failure and AMS requiring intubation on 8/4, extubated 8/7.   Pt alert but confused on visit today. NPO, SLP eval pending.  Spoke with Richardson Landry RD at [facility] who reports pt po intake had improved over the past several months but eating 50% of meals or less. Pt receiving Glucerna shakes TID and Prostat supplement and consuming about 50% of this.   Nutrition-Focused physical exam completed. Findings are mild/moderate fat depletion, mild/moderate to severe muscle depletion, and no edema.   Past Medical History:  Diagnosis Date  . A-fib ([place])   . Cancer ([place])   . Diabetes mellitus without complication ([place])   . MI (myocardial infarction) ([place])   . Pressure ulcer of sacrum    Chronic, start date unknown    Diet Order:   NPO  Skin:  Wound (see comment) (stage III/unstageable sacrum, stage IV scrotum)  Last BM:  8/7   Labs: reviewed  Meds: reviewed  Height:   Ht Readings from Last 1 Encounters:  05/28/16 6' (1.829 m)    Weight:   Wt Readings from Last 1 Encounters:  05/31/16 190 lb 14.7 oz (86.6 kg)   Wt Readings from Last 10 Encounters:  05/31/16 190 lb 14.7 oz (86.6 kg)  03/09/16 160 lb 7 oz (72.8 kg)     BMI:   Body mass index is 25.89 kg/m.  Estimated Nutritional Needs:   Kcal:  2200-2500 kcals  Protein:  110-125 g  Fluid:  >/= 2.2 L  EDUCATION NEEDS:   No education needs identified at this time  [place], [place], [place] [phone] Pager  [phone] Weekend/On-Call Pager

## 2016-05-31 NOTE — Plan of Care (Signed)
Problem: SLP Dysphagia Goals Goal: Misc Dysphagia Goal Pt will safely tolerate po diet of least restrictive consistency w/ no overt s/s of aspiration noted by Staff/pt/family x3 sessions.    

## 2016-05-31 NOTE — Progress Notes (Signed)
Report given to [facility] on 1C, patient to transfer shortly. Wilnette Kales

## 2016-05-31 NOTE — Evaluation (Signed)
Clinical/Bedside Swallow Evaluation Patient Details  Name: Paul Mcmahon MRN: [id number] Date of Birth: 1925/05/06  Today's Date: 05/31/2016 Time: SLP Start Time (ACUTE ONLY): 35 SLP Stop Time (ACUTE ONLY): 1205 SLP Time Calculation (min) (ACUTE ONLY): 60 min  Past Medical History:  Past Medical History:  Diagnosis Date  . A-fib ([place])   . Cancer ([place])   . Diabetes mellitus without complication ([employer])   . MI (myocardial infarction) ([place])   . Pressure ulcer of sacrum    Chronic, start date unknown   Past Surgical History:  Past Surgical History:  Procedure Laterality Date  . PERIPHERAL VASCULAR CATHETERIZATION N/A 03/11/2016   Procedure: IVC Filter Insertion;  Surgeon: Algernon Huxley, MD;  Location: [facility];  Service: Cardiovascular;  Laterality: N/A;   HPI:  Pt is an 82 y/o SNF resident with a PMH of afib on Eliquis, prostate Ca, T2DM and CAD S/P MI who presents with hypoxia with SPO2 in the 60s, AMS and suspected aspiration. History is obtained from ED records as patient is currently intubated and sedated. Apparently, SNF staff noted that patient was "not looking right" after dinner hence they checked his vital signs and found that his SPO2 was in the 60s. He was placed on O2 via [place] without improvement hence EMS was called. Upon EMS arrival, patient was still hypoxic hence he was placed on CPAP.  His SPO2 improved to 93% but kept fluctuating in the low 80s. While in the ED, became tachypneic, hypoxic on BiPAP and hypotensive with blood pressure of 61/52. He was asked if he wanted to be intubated and he rescinded the DNI and was intubated. Pt was extubated and continues on  O2 support. Pt verbally responsive to basic questions re: self but appeared confused w/ some disorganized reponses. He required verbal cues to follow instructions. Unsure of baseline Cognitive status. Pt does reside at [facility] and is followed by Pallative care there per chart notes.    Assessment / Plan /  Recommendation Clinical Impression  Pt appeared to tolerate po trials w/ SLP following aspiration precautions including small, single sips of thin liquids. Pt presented w/ moderate confusion w/ ongoing mumbled talking and movements at times even w/ po trials still in his mouth(water). This resulted in spillage and coughing x1. Pt was given verbal and visual cues to follow aspiration precautions; assisted w/ feeding d/t inability to use bilateral UEs secondary to weakness and contraction. Pt's behavior was min impulsive - unsure of his baseline Cogntive status at [facility]. No immediate, overt s/s of aspiration were noted w/ the po trials w/ small, single sip/bite trials - no decline in respiratory status or O2 sats per monitor; no wet vocal quality. Pt exhibited min increased oral phase time w/ the increased textured foods(puree) but cleared when given time and as he used lingual sweeping. Pt quickly declined po's after few trials offered. Pt appears at min increasd risk for aspiration at this time. Recommend a Dysphagia 2 diet consistency w/ thin liquids w/ strict aspiration precautions and feeding support/monitoring by NSG. Recommend meds in Puree - Crushed as necessary/able. ST will f/u w/ ongoing assessment of toleration of diet. NSG updated.     Aspiration Risk  Mild aspiration risk    Diet Recommendation  Dysphagia 2 w/ thin liquids; strict aspiration precautions; feeding assistance and monitoring at all meals.  Medication Administration: Whole meds with puree (or crushed if indicated/able to) for safer, easier swallowing   Other  Recommendations Recommended Consults: Consider GI  evaluation Oral Care Recommendations: Oral care BID;Staff/trained caregiver to provide oral care Other Recommendations:  (Dietician f/u)   Follow up Recommendations  Skilled Nursing facility (TBD)    Frequency and Duration min 3x week  2 weeks       Prognosis Prognosis for Safe Diet Advancement: Fair Barriers to Reach  Goals: Cognitive deficits (medical status)      Swallow Study   General Date of Onset: 05/27/16 HPI: Pt is an 82 y/o SNF resident with a PMH of afib on Eliquis, prostate Ca, T2DM and CAD S/P MI who presents with hypoxia with SPO2 in the 60s, AMS and suspected aspiration. History is obtained from ED records as patient is currently intubated and sedated. Apparently, SNF staff noted that patient was "not looking right" after dinner hence they checked his vital signs and found that his SPO2 was in the 60s. He was placed on O2 via [place] without improvement hence EMS was called. Upon EMS arrival, patient was still hypoxic hence he was placed on CPAP.  His SPO2 improved to 93% but kept fluctuating in the low 80s. While in the ED, became tachypneic, hypoxic on BiPAP and hypotensive with blood pressure of 61/52. He was asked if he wanted to be intubated and he rescinded the DNI and was intubated. Pt was extubated and continues on  O2 support. Pt verbally responsive to basic questions re: self but appeared confused w/ some disorganized reponses. He required verbal cues to follow instructions. Unsure of baseline Cognitive status. Pt does reside at [facility] and is followed by Pallative care there per chart notes.  Type of Study: Bedside Swallow Evaluation Previous Swallow Assessment: none indicated Diet Prior to this Study: Regular;Thin liquids (per pt) Temperature Spikes Noted: No (wbc not elevated) Respiratory Status: Nasal cannula (2 liters) History of Recent Intubation: Yes Length of Intubations (days): 3 days Date extubated: 05/30/16 Behavior/Cognition: Cooperative;Alert;Confused;Impulsive;Distractible;Requires cueing Oral Cavity Assessment: Dry Oral Care Completed by SLP: Recent completion by staff Oral Cavity - Dentition: Adequate natural dentition;Missing dentition Vision:  (n/a) Self-Feeding Abilities: Total assist (weakness in UEs) Patient Positioning: Upright in bed (pt did not want to sit fully  upright initially) Baseline Vocal Quality: Low vocal intensity (mumbled speech) Volitional Cough: Cognitively unable to elicit Volitional Swallow: Unable to elicit    Oral/Motor/Sensory Function Overall Oral Motor/Sensory Function: Within functional limits (grossly appearing w/ bolus management)   Ice Chips Ice chips: Within functional limits Presentation: Spoon (fed; 3 trials)   Thin Liquid Thin Liquid: Impaired Presentation: Straw (helped to hold cup w/ SLP; 10 trials) Oral Phase Impairments: Poor awareness of bolus (x2 pt attempted to talk w/ water still in his mouth) Oral Phase Functional Implications:  (spillage x2) Pharyngeal  Phase Impairments: Suspected delayed Swallow;Multiple swallows (cough x1/2 talking w/ water still in mouth) Other Comments: ongoing belching post trials    Nectar Thick Nectar Thick Liquid: Not tested   Honey Thick Honey Thick Liquid: Not tested   Puree Puree: Within functional limits Presentation: Spoon (fed; 6 trials) Other Comments: he then declined further po's   Solid   GO   Solid: Within functional limits Presentation: Spoon (x1 trial accepted) Other Comments: talking again w/ food in mouth       Orinda Kenner, [place], [place] 05/31/2016,3:22 PM

## 2016-05-31 NOTE — Progress Notes (Addendum)
PULMONARY / CRITICAL CARE MEDICINE   Name: Paul Mcmahon MRN: [id number] DOB: 1925-04-06    ADMISSION DATE:  05/27/2016  REFERRING MD: Dr. Quentin Cornwall  CHIEF COMPLAINT:  AMS, hypoxia  PT PROFILE: 82 M SNF resident with severe chronic debilitation and multiple chronic medical problems intubated in ED on 08/04 early AM for acute hypoxic respiratory failure, AMS  MAJOR EVENTS/TEST RESULTS: 08/04 presented to ED in respiratory distress. Intubated 08/05 Admitted to [facility] service. Principal diagnoses of severe sepsis/septic shock due to infected decubitus ulcer and acute respiratory failure with bilateral pleural effusions. Made DNR after discussion with pt's sons 08/05 TTE: LVEF 60-65%, LA mildly dilated 8/7 successfully extubated, patient is DNR/DNI. spoke with Dr. Earleen Newport for transfer of care to hospitalist service  INDWELLING DEVICES:: ETT 08/04 >>  LUE PICC (chronic) >>   MICRO DATA: MRSA PCR 08/05 >> POS Urine 08/04 >> greater than 100k yeast Blood 08/04 >>  Resp 08/05 >>  Wound 08/05 >>    ANTIMICROBIALS:  Vanc 08/04 >>  Cefepime 08/04 >>  Fluconazole 08/05 >>   SUBJECTIVE:  Alert and awake, NAD, will transfer to gen med floor today  VITAL SIGNS: BP 92/80   Pulse 78   Temp 97.8 F (36.6 C) (Oral)   Resp 20   Ht 6' (1.829 m)   Wt 190 lb 14.7 oz (86.6 kg)   SpO2 96%   BMI 25.89 kg/m      VENTILATOR SETTINGS: Vent Mode: Spontaneous FiO2 (%):  [25 %] 25 % Set Rate:  [12 bmp] 12 bmp Vt Set:  [500 mL] 500 mL PEEP:  [5 cmH20] 5 cmH20 Pressure Support:  [3 cmH20-8 cmH20] 3 cmH20  INTAKE / OUTPUT: I/O last 3 completed shifts: In: 1366.4 [I.V.:475; NG/GT:391.3; IV Piggyback:500] Out: 2975 [Urine:2975]  PHYSICAL EXAMINATION: General: Chronically ill, NAD, RASS -2 Neuro: CNs intact, MAEs. DTRs symmetric HEENT: [place]/AT, pupils pinpoint, sluggish Cardiovascular: IRIR, S1/S2, no MRG Lungs: bilateral coarse BS, no wheezes Abdomen: Non-distended, normal bowel  sounds Extremities: trace symmetric edema Skin: 2 large stage 4 sacral ulcers with tunneling  LABS:  BMET  Recent Labs Lab 05/28/16 0434 05/29/16 0426 05/30/16 0438 05/31/16 0500  NA 130* 132* 131*  --   K 5.0 4.6 4.3  --   CL 95* 97* 98*  --   CO2 26 27 28   --   BUN 42* 43* 45*  --   CREATININE 0.76 0.78 0.69 0.69  GLUCOSE 136* 140* 115*  --     Electrolytes  Recent Labs Lab 05/28/16 0050 05/28/16 0434 05/29/16 0426 05/30/16 0438  CALCIUM  --  9.5 9.5 9.2  MG 2.0 2.1  --  2.3  PHOS 6.5* 6.7*  --  4.5    CBC  Recent Labs Lab 05/29/16 0426 05/30/16 0438 05/31/16 0500  WBC 12.9* 10.1 8.1  HGB 8.9* 8.6* 8.4*  HCT 27.7* 26.1* 25.8*  PLT 253 214 160    Coag's No results for input(s): APTT, INR in the last 168 hours.  Sepsis Markers  Recent Labs Lab 05/27/16 2119 05/28/16 0050  LATICACIDVEN 1.6 2.1*  PROCALCITON  --  2.83    ABG  Recent Labs Lab 05/27/16 2150 05/28/16 0353  PHART 7.39 7.41  PCO2ART 50* 42  PO2ART 93 281*    Liver Enzymes  Recent Labs Lab 05/27/16 0940 05/27/16 2102 05/29/16 0426  AST 24 27 20   ALT 13* 15* 13*  ALKPHOS 104 114 80  BILITOT 0.3 0.5 0.7  ALBUMIN 1.7* 1.8* 1.7*  Cardiac Enzymes  Recent Labs Lab 05/27/16 2102 05/28/16 0050 05/28/16 0434  TROPONINI 0.03* <0.03 0.03*    Glucose  Recent Labs Lab 05/30/16 0800 05/30/16 1108 05/30/16 1527 05/30/16 2019 05/31/16 0044 05/31/16 0737  GLUCAP 119* 150* 152* 53* 155* 36*   ASSESSMENT / PLAN:   82 yo white male admitted to ICU for acute resp failure from severe sepsis from infected decub ulcer with underlying effusions Extubated 8/7 successfully, patient is DNR/DNI  PULMONARY A: Acute hypoxic respiratory failure-resolved Oxygen and BD therapy as needed  CARDIOVASCULAR Does not need ICU monitoring at this time  RENAL A:   Mild hyponatremia Hypervolemia P:   Monitor BMET intermittently Monitor I/Os Correct electrolytes as  indicated  GASTROINTESTINAL Check swallo study, speech therapy  HEMATOLOGIC A:   Chronic anemia P:  DVT px: apixaban for chronic AF Monitor CBC intermittently Transfuse per usual guidelines  INFECTIOUS A:   Severe sepsis Septic sacral decub ulcers Possible HCAP Yeast UTI P:   Monitor temp, WBC count Micro and abx as above     Best Practice: Code Status: DNR but DNI Diet:NPO GI prophylaxis: H2 blocker VTE prophylaxis:  SCD's / already on apixaban    The Patient requires high complexity decision making for assessment and support, frequent evaluation and titration of therapies.   Transfer patient to gen med floor, Hospitalist to take over 8/9.   Corrin Parker, M.D.  [facility]  Medical Director [place] Director [facility] Cardio-Pulmonary Department

## 2016-05-31 NOTE — Progress Notes (Signed)
Dr. Mortimer Fries critical care specialist signed out patient to the medical team. Care will be taken over tomorrow by Dr. Leslye Peer.

## 2016-05-31 NOTE — Progress Notes (Signed)
Pharmacy Antibiotic Note  Paul Mcmahon is a 82 y.o. male admitted on 05/27/2016 with pneumonia. UA from 8/4 consistent with UTI. Ucx showing >100,000 colonies/mL yeast.  Pharmacy  consulted for Candidiasis UTI dosing.  Plan: Will Continue patient on Fluconazole 200mg  IV every 24 hours.   Height: 6' (182.9 cm) Weight: 190 lb 14.7 oz (86.6 kg) IBW/kg (Calculated) : 77.6  Temp (24hrs), Avg:97.4 F (36.3 C), Min:96.9 F (36.1 C), Max:97.8 F (36.6 C)   Recent Labs Lab 05/27/16 2102 05/27/16 2119 05/28/16 0050 05/28/16 0434 05/28/16 1716 05/29/16 0426 05/30/16 0438 05/31/16 0500 05/31/16 1203  WBC 15.7*  --   --  22.2*  --  12.9* 10.1 8.1  --   CREATININE 0.54*  --   --  0.76  --  0.78 0.69 0.69 0.70  LATICACIDVEN  --  1.6 2.1*  --   --   --   --   --   --   VANCOTROUGH  --   --   --  35* 30*  --   --   --   --   VANCORANDOM 30  --   --   --   --  25  --   --   --     Estimated Creatinine Clearance: 68.7 mL/min (by C-G formula based on SCr of 0.8 mg/dL).    Allergies  Allergen Reactions  . Penicillins Other (See Comments)    Reaction: Welts and blisters all over body    Antimicrobials this admission: 8/6 Fluconazole >> 8/4 Cefepime >>  7/22 Vancomycin  >> was taking at nursing home prior to admission   Microbiology results: 8/4 BCx: NG x12 hours 8/4 UCx: >100,000 yeast  8/5 Wound Cx: Mod GNR, Few GPC 8/5 Respiratory: Rare GPC  8/5 MRSA PCR: positive   Thank you for allowing pharmacy to be a part of this patient's care.  Larene Beach, PharmD  Clinical Pharmacist 05/31/2016 1:52 PM

## 2016-06-01 ENCOUNTER — Ambulatory Visit: Payer: Medicare Other | Admitting: General Surgery

## 2016-06-01 DIAGNOSIS — Z66 Do not resuscitate: Secondary | ICD-10-CM

## 2016-06-01 DIAGNOSIS — Z515 Encounter for palliative care: Secondary | ICD-10-CM

## 2016-06-01 DIAGNOSIS — J9621 Acute and chronic respiratory failure with hypoxia: Secondary | ICD-10-CM

## 2016-06-01 DIAGNOSIS — Z789 Other specified health status: Secondary | ICD-10-CM

## 2016-06-01 DIAGNOSIS — R0682 Tachypnea, not elsewhere classified: Secondary | ICD-10-CM

## 2016-06-01 LAB — BASIC METABOLIC PANEL
Anion gap: 9 (ref 5–15)
BUN: 33 mg/dL — ABNORMAL HIGH (ref 6–20)
CHLORIDE: 99 mmol/L — AB (ref 101–111)
CO2: 25 mmol/L (ref 22–32)
CREATININE: 0.71 mg/dL (ref 0.61–1.24)
Calcium: 9.1 mg/dL (ref 8.9–10.3)
Glucose, Bld: 236 mg/dL — ABNORMAL HIGH (ref 65–99)
POTASSIUM: 3.5 mmol/L (ref 3.5–5.1)
SODIUM: 133 mmol/L — AB (ref 135–145)

## 2016-06-01 LAB — VANCOMYCIN, TROUGH: VANCOMYCIN TR: 18 ug/mL (ref 15–20)

## 2016-06-01 LAB — CBC
HCT: 25.1 % — ABNORMAL LOW (ref 40.0–52.0)
HEMOGLOBIN: 8.3 g/dL — AB (ref 13.0–18.0)
MCH: 26.7 pg (ref 26.0–34.0)
MCHC: 33.1 g/dL (ref 32.0–36.0)
MCV: 80.6 fL (ref 80.0–100.0)
PLATELETS: 170 10*3/uL (ref 150–440)
RBC: 3.11 MIL/uL — AB (ref 4.40–5.90)
RDW: 19.1 % — ABNORMAL HIGH (ref 11.5–14.5)
WBC: 10.9 10*3/uL — ABNORMAL HIGH (ref 3.8–10.6)

## 2016-06-01 LAB — GLUCOSE, CAPILLARY
GLUCOSE-CAPILLARY: 258 mg/dL — AB (ref 65–99)
GLUCOSE-CAPILLARY: 232 mg/dL — AB (ref 65–99)
GLUCOSE-CAPILLARY: 205 mg/dL — AB (ref 65–99)
Glucose-Capillary: 299 mg/dL — ABNORMAL HIGH (ref 65–99)

## 2016-06-01 LAB — CULTURE, BLOOD (ROUTINE X 2)
Culture: NO GROWTH
Culture: NO GROWTH

## 2016-06-01 MED ORDER — ACETAMINOPHEN 325 MG PO TABS: 650.0000 mg | ORAL_TABLET | ORAL | Status: DC | PRN

## 2016-06-01 MED ORDER — INSULIN ASPART 100 UNIT/ML ~~LOC~~ SOLN: 0.0000 [IU] | Freq: Every day | SUBCUTANEOUS | Status: DC

## 2016-06-01 MED ORDER — QUETIAPINE FUMARATE 25 MG PO TABS
25.0000 mg | ORAL_TABLET | Freq: Every day | ORAL | Status: DC
  Filled 2016-06-01: qty 1

## 2016-06-01 MED ORDER — INSULIN ASPART 100 UNIT/ML ~~LOC~~ SOLN
0.0000 [IU] | Freq: Three times a day (TID) | SUBCUTANEOUS | Status: DC
  Administered 2016-06-01 (×2): 3 [IU] via SUBCUTANEOUS
  Filled 2016-06-01 (×2): qty 3

## 2016-06-01 MED ORDER — LORAZEPAM 2 MG/ML IJ SOLN: 1.0000 mg | INTRAMUSCULAR | Status: DC | PRN

## 2016-06-01 MED ORDER — SENNOSIDES 8.8 MG/5ML PO SYRP
5.0000 mL | ORAL_SOLUTION | Freq: Two times a day (BID) | ORAL | Status: DC | PRN
  Filled 2016-06-01: qty 5

## 2016-06-01 MED ORDER — MORPHINE SULFATE (PF) 2 MG/ML IV SOLN
2.0000 mg | INTRAVENOUS | Status: DC | PRN
  Administered 2016-06-01: 20:00:00 2 mg via INTRAVENOUS
  Filled 2016-06-01: qty 1

## 2016-06-01 MED ORDER — INSULIN GLARGINE 100 UNIT/ML ~~LOC~~ SOLN
10.0000 [IU] | Freq: Every day | SUBCUTANEOUS | Status: DC
  Administered 2016-06-01: 14:00:00 10 [IU] via SUBCUTANEOUS
  Filled 2016-06-01: qty 0.1

## 2016-06-01 NOTE — Progress Notes (Addendum)
Pharmacy Antibiotic Note  Paul Mcmahon is a 82 y.o. male admitted on 05/27/2016 with pneumonia.  Pharmacy has been consulted for cefepime and vancomycin dosing.  Patient was on outpatient vancomycin. Receiving 1500mg  IV every 24 hours.   In ED patient received unspecified amount of vancomycin.  0805 0434 vancomycin level  35 mcg/mL    0805 1716 Vanc random level 30 mcg/mL  0806 Recalculated vancomycin Ke from 2 levels approximately 0.016 hr-1. Dose resumed at 0800 when predicted trough 15 mcg/mL. Schedule 1 gm IV Q48H, predicted trough 15 mcg/mL. Pharmacy will continue to follow and adjust as needed to maintain trough 15 to 20 mcg/mL  0809 Trough 18  Plan: 1.Continue  Cefepime 2 gm IV Q8H  2. Trough today therapeutic at 18 mcg/mL. Will continue current dose of Vancomycin 1gm every 48 hours. Due to trough level higher then predicted,  Will verify trough level @ 0730 on 8/11    Height: 6' (182.9 cm) Weight: 184 lb (83.5 kg) IBW/kg (Calculated) : 77.6  Temp (24hrs), Avg:98.1 F (36.7 C), Min:97.5 F (36.4 C), Max:98.6 F (37 C)   Recent Labs Lab 05/27/16 2102 05/27/16 2119 05/28/16 0050 05/28/16 0434 05/28/16 1716 05/29/16 0426 05/30/16 0438 05/31/16 0500 05/31/16 1203 06/01/16 0509 06/01/16 0748  WBC 15.7*  --   --  22.2*  --  12.9* 10.1 8.1  --  10.9*  --   CREATININE 0.54*  --   --  0.76  --  0.78 0.69 0.69 0.70 0.71  --   LATICACIDVEN  --  1.6 2.1*  --   --   --   --   --   --   --   --   VANCOTROUGH  --   --   --  35* 30*  --   --   --   --   --  18  VANCORANDOM 30  --   --   --   --  25  --   --   --   --   --     Estimated Creatinine Clearance: 68.7 mL/min (by C-G formula based on SCr of 0.8 mg/dL).    Allergies  Allergen Reactions  . Penicillins Other (See Comments)    Reaction: Welts and blisters all over body    Thank you for allowing pharmacy to be a part of this patient's care.  Nancy Fetter, PharmD Clinical Pharmacist 06/01/2016 9:19 AM

## 2016-06-01 NOTE — Progress Notes (Signed)
Inpatient Diabetes Program Recommendations  AACE/ADA: New Consensus Statement on Inpatient Glycemic Control (2015)  Target Ranges:  Prepandial:   less than 140 mg/dL      Peak postprandial:   less than 180 mg/dL (1-2 hours)      Critically ill patients:  140 - 180 mg/dL  Results for Paul Mcmahon, Paul Mcmahon (MRN [id number]) as of 06/01/2016 09:35  Ref. Range 05/31/2016 07:37 05/31/2016 12:01 05/31/2016 17:03 05/31/2016 20:16 05/31/2016 23:49 06/01/2016 04:09 06/01/2016 07:51  Glucose-Capillary Latest Ref Range: 65 - 99 mg/dL 166 (H) 172 (H) 243 (H) 336 (H) 299 (H) 258 (H) 232 (H)    Review of Glycemic Control  Diabetes history: DM2 Outpatient Diabetes medications: 70/30 22 units @ 9am, 70/30 12 units @ 5pm, Novolog 5 units TID with meals Current orders for Inpatient glycemic control: Novolog 0-9 units TID with meals,Novolog 0-5 units QHS  Inpatient Diabetes Program Recommendations:  Insulin - Basal: Please consider ordering Lantus 10 units Q24H starting now. Insulin - Meal Coverage: Please consider ordering Novolog 4 units TID with meals if patient eats at least 50% of meals.  Thanks, Barnie Alderman, RN, MSN, CDE Diabetes Coordinator Inpatient Diabetes Program [phone] (Team Pager from [place] to [place]) [phone] ([facility] office) [phone] [facility] office) [phone] [facility] office)

## 2016-06-01 NOTE — Clinical Social Work Note (Signed)
CSW received call yesterday afternoon from Maudie Mercury at [facility] stating that because patient was still in the hospital, they were going to send back patient's specialty bed and then reorder it when he was to return (due to being charged daily for it while patient was not at their facility). CSW informed that patient was extubated and was to move off the ICU floor.  Shela Leff MSW,LCSW [phone]

## 2016-06-01 NOTE — Consult Note (Signed)
Consultation Note Date: 06/01/2016   Patient Name: Paul Mcmahon  DOB: [birth year]  MRN: [id number]  Age / Sex: 82 y.o., male  PCP: Juluis Pitch, MD Referring Physician: Loletha Grayer, MD  Reason for Consultation: Establishing goals of care, Non pain symptom management, Pain control and Psychosocial/spiritual support  HPI/Patient Profile: 82 y.o. male   admitted on 05/27/2016 with PMH of afib (on Eliquis), prostate Ca, T2DM and CAD S/P MI who presents with hypoxia with SPO2 in the 62s, AMS and suspected aspiration.    Apparently, SNF staff noted that patient was "not looking right" after dinner hence they checked his vital signs and found that his SPO2 was in the 60s. He was placed on O2 via [place] without improvement hence EMS was called. Upon EMS arrival, patient was still hypoxic hence he was placed on CPAP.  His SPO2 improved to 93% but kept fluctuating in the low 80s. While in the ED, became tachypneic, hypoxic on BiPAP and hypotensive with blood pressure of 61/52. He was intubated.  Currently extubated and family is faced with advanced directive decisions and anticipatory care needs.   Patient has had continued physical, functional and cognitive decline over the past several months.  He is a SNF resident for decubiti ulcers.   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    But today they are at peace with their decision to focus on comfort and accept a natural death.   Clinical Assessment and Goals of Care:   This NP Wadie Lessen reviewed medical records, received report from team, assessed the patient and then meet at the patient's bedside along with his family to include son/HPOA/Paul Mcmahon, wife Paul Mcmahon to discuss diagnosis, prognosis, GOC, EOL wishes disposition and  options.  A detailed discussion was had today regarding advanced directives.  Concepts specific to code status, artifical feeding and hydration, continued IV antibiotics and rehospitalization was had.  The difference between a aggressive medical intervention path  and a palliative comfort care path for this patient at this time was had.  Values and goals of care important to patient and family were attempted to be elicited.  Concept of Hospice and Palliative Care were discussed  Natural trajectory and expectations at EOL were discussed.  Questions and concerns addressed.   Family encouraged to call with questions or concerns.  PMT will continue to support holistically.   SUMMARY OF RECOMMENDATIONS    Code Status/Advance Care Planning:  DNR   Symptom Management:   Pain/Dyspnea: Moprhine 2 mg IV every 1 hr prn  Agitation: Ativan 1 mg IV every 4 hrs prn  Palliative Prophylaxis:   Aspiration, Bowel Regimen, Frequent Pain Assessment and Oral Care  Additional Recommendations (Limitations, Scope, Preferences):  Full Comfort Care, Minimize Medications, No Blood Transfusions, No Diagnostics, No Glucose Monitoring, No IV Antibiotics, No IV Fluids and No Lab Draws  Psycho-social/Spiritual:   Desire  for further Chaplaincy support:no  Additional Recommendations: Education on Hospice  Prognosis:   < 2 weeks   Family verbalize desire for [facility] services if patient expires here in the hospital  Discharge Planning:   Family is open to transition to hospice facility, will evaluate  patient in morning for appropriateness.      Primary Diagnoses: Present on Admission: . Respiratory failure with hypoxia ([place])   I have reviewed the medical record, interviewed the patient and family, and examined the patient. The following aspects are pertinent.  Past Medical History:  Diagnosis Date  . A-fib ([place])   . Cancer ([place])   . Diabetes mellitus without complication ([place])    . MI (myocardial infarction) ([place])   . Pressure ulcer of sacrum    Chronic, start date unknown   Social History   Social History  . Marital status: Married    Spouse name: N/A  . Number of children: N/A  . Years of education: N/A   Social History Main Topics  . Smoking status: Never Smoker  . Smokeless tobacco: Never Used  . Alcohol use No  . Drug use: No  . Sexual activity: No   Other Topics Concern  . None   Social History Narrative  . None   History reviewed. No pertinent family history. Scheduled Meds: . antiseptic oral rinse  7 mL Mouth Rinse q12n4p  . ceFEPime (MAXIPIME) IV  2 g Intravenous Q8H  . chlorhexidine  15 mL Mouth Rinse BID  . famotidine (PEPCID) IV  20 mg Intravenous Q12H  . HYDROGEL  1 application Topical Daily  . insulin aspart  0-5 Units Subcutaneous QHS  . insulin aspart  0-9 Units Subcutaneous TID WC  . insulin glargine  10 Units Subcutaneous Daily  . QUEtiapine  25 mg Oral QHS  . vancomycin  1,000 mg Intravenous Q48H   Continuous Infusions:  PRN Meds:.sodium chloride, acetaminophen, albuterol, bisacodyl, ondansetron (ZOFRAN) IV, sennosides Medications Prior to Admission:  Prior to Admission medications   Medication Sig Start Date End Date Taking? Authorizing Provider  acetaminophen (TYLENOL) 325 MG tablet Take 2 tablets (650 mg total) by mouth every 6 (six) hours as needed for mild pain (or Fever >/= 101). 03/15/16  Yes Dustin Flock, MD  apixaban (ELIQUIS) 5 MG TABS tablet Take 1 tablet (5 mg total) by mouth 2 (two) times daily. 03/20/16  Yes Dustin Flock, MD  atorvastatin (LIPITOR) 10 MG tablet Take 10 mg by mouth at bedtime.   Yes Historical Provider, MD  b complex vitamins tablet Take 1 tablet by mouth daily.   Yes Historical Provider, MD  cholecalciferol (VITAMIN D) 1000 units tablet Take 1,000 Units by mouth daily.   Yes Historical Provider, MD  digoxin (LANOXIN) 0.125 MG tablet Take 0.125 mg by mouth daily.   Yes Historical Provider, MD   feeding supplement, ENSURE ENLIVE, (ENSURE ENLIVE) LIQD Take 237 mLs by mouth 3 (three) times daily after meals. 03/15/16  Yes Dustin Flock, MD  fentaNYL (DURAGESIC - DOSED MCG/HR) 25 MCG/HR patch Place 25 mcg onto the skin every 3 (three) days.   Yes Historical Provider, MD  ferrous sulfate 325 (65 FE) MG tablet Take 325 mg by mouth daily with breakfast.   Yes Historical Provider, MD  furosemide (LASIX) 20 MG tablet Take 20 mg by mouth daily.   Yes Historical Provider, MD  insulin aspart (NOVOLOG) 100 UNIT/ML injection Inject 3 Units into the skin 3 (three) times daily with meals. Patient taking differently:  Inject 5 Units into the skin 3 (three) times daily with meals.  03/15/16  Yes Dustin Flock, MD  insulin aspart protamine- aspart (NOVOLOG MIX 70/30) (70-30) 100 UNIT/ML injection Inject 12-22 Units into the skin 2 (two) times daily. 12 units at 5 pm  22 units at 9 am   Yes Historical Provider, MD  LEVOFLOXACIN IV Inject 750 mg/day into the vein daily. Given at 10 pm   Yes Historical Provider, MD  magnesium oxide (MAG-OX) 400 (241.3 Mg) MG tablet Take 400 mg by mouth 2 (two) times daily.   Yes Historical Provider, MD  metoprolol succinate (TOPROL-XL) 50 MG 24 hr tablet Take 50 mg by mouth daily. Take with or immediately following a meal.   Yes Historical Provider, MD  mirtazapine (REMERON) 7.5 MG tablet Take 7.5 mg by mouth at bedtime.   Yes Historical Provider, MD  Multiple Vitamin (MULTIVITAMIN WITH MINERALS) TABS tablet Take 1 tablet by mouth daily.   Yes Historical Provider, MD  ondansetron (ZOFRAN) 4 MG tablet Take 1 tablet (4 mg total) by mouth every 6 (six) hours as needed for nausea. 03/15/16  Yes Dustin Flock, MD  oxyCODONE (OXY IR/ROXICODONE) 5 MG immediate release tablet Take 1 tablet (5 mg total) by mouth every 4 (four) hours as needed for moderate pain. 03/15/16  Yes Dustin Flock, MD  polyethylene glycol (MIRALAX / GLYCOLAX) packet Take 17 g by mouth daily.   Yes Historical  Provider, MD  sennosides-docusate sodium (SENOKOT-S) 8.6-50 MG tablet Take 1 tablet by mouth 2 (two) times daily.   Yes Historical Provider, MD  tamsulosin (FLOMAX) 0.4 MG CAPS capsule Take 1 capsule (0.4 mg total) by mouth daily after supper. 03/15/16  Yes Dustin Flock, MD  VANCOMYCIN HCL IV Inject 1.5 g into the vein daily. Given at 11 am   Yes Historical Provider, MD   Allergies  Allergen Reactions  . Penicillins Other (See Comments)    Reaction: Welts and blisters all over body   Review of Systems  Unable to perform ROS   Physical Exam  Constitutional: He appears lethargic. He appears cachectic. He appears ill.  Cardiovascular: Normal rate, regular rhythm and normal heart sounds.   Pulmonary/Chest: He has decreased breath sounds in the right lower field and the left lower field.  Abdominal: Soft. Bowel sounds are decreased.  Neurological: He appears lethargic.  Skin: Skin is warm and dry.    Vital Signs: BP 126/71 (BP Location: Right Arm)   Pulse 84   Temp 98.2 F (36.8 C)   Resp 16   Ht 6' (1.829 m)   Wt 83.5 kg (184 lb)   SpO2 98%   BMI 24.95 kg/m  Pain Assessment: PAINAD   Pain Score: 0-No pain   SpO2: SpO2: 98 % O2 Device:SpO2: 98 % O2 Flow Rate: .O2 Flow Rate (L/min): 2 L/min  IO: Intake/output summary:  Intake/Output Summary (Last 24 hours) at 06/01/16 1411 Last data filed at 06/01/16 1118  Gross per 24 hour  Intake                0 ml  Output             2300 ml  Net            -2300 ml    LBM: Last BM Date: 05/31/16 Baseline Weight: Weight: 88.6 kg (195 lb 5.2 oz) Most recent weight: Weight: 83.5 kg (184 lb)      Palliative Assessment/Data: 30 % at best   Flowsheet  Rows   Flowsheet Row Most Recent Value  Intake Tab  Referral Department  Critical care  Unit at Time of Referral  ICU  Palliative Care Primary Diagnosis  Sepsis/Infectious Disease  Date Notified  05/30/16  Palliative Care Type  Not seen  Reason Not Seen  Hospice referral more  appropriate  Reason for referral  Clarify Goals of Care, Counsel Regarding Hospice  Date of Admission  05/27/16  # of days IP prior to Palliative referral  3  Clinical Assessment  Psychosocial & Spiritual Assessment  Palliative Care Outcomes     Discussed with Dr Leslye Peer  Time In: 1415 Time Out: 1530 Time Total: 75 min Greater than 50%  of this time was spent counseling and coordinating care related to the above assessment and plan.  Signed by: Wadie Lessen, NP   Please contact Palliative Medicine Team phone at [phone] for questions and concerns.  For individual provider: See Shea Evans

## 2016-06-01 NOTE — Progress Notes (Signed)
Speech Language Pathology Treatment: Dysphagia  Patient Details Name: Paul Mcmahon MRN: [id number] DOB: [birth year] Today's Date: 06/01/2016 Time: [id number] SLP Time Calculation (min) (ACUTE ONLY): 52 min  Assessment / Plan / Recommendation Clinical Impression  Pt presented w/ increased lethargy and drowsiness today w/ mumbled speech and no clear, concise verbal interaction w/ SLP when asked direct, basic question or given basic commands. Pt was presented w/ trials of thin liquids first w/ overt s/s of aspiration noted; no immediate, overt s/s of aspiration were noted w/ ts and small cup sips of Nectar liquids. However, pt exhibited moderately decreased oral awareness for oral intake and bolus material of all trials tended to lay anteriorly in his mouth and/or spill anteriorly(liquids) before he began bolus manipulation and completed the A-P transfer and swallow. No coughing noted w/ the few trials of puree. Pt required full assistance w/ feeding and mod-max verbal/tactile/visual cues for follow through w/ task of po intake.  Due to pt's declined status/awareness from BSE yesterday, recommend a modified diet of Dysphagia 2 w/ Nectar liquids w/ strict aspiration precautions and full feeding assistance and monitoring at meals to reduce risk for aspiration. Recommend meds crushed in puree as able to crush. ST will f/u w/ toleration of diet and trials to upgrade when safe and appropriate.  MD and NSG consulted post tx session re: poc.    HPI  see eval      SLP Plan  Continue with current plan of care (MBSS to be considered)     Recommendations  Diet recommendations: Dysphagia 1 (puree);Nectar-thick liquid Liquids provided via: Teaspoon;Cup;Straw ((no straw if coughing noted)) Medication Administration: Crushed with puree Supervision: Full supervision/cueing for compensatory strategies;Staff to assist with self feeding Compensations: Slow rate;Small sips/bites;Lingual sweep for clearance of  pocketing;Follow solids with liquid Postural Changes and/or Swallow Maneuvers: Seated upright 90 degrees;Upright 30-60 min after meal             General recommendations:  (dietician f/u) Oral Care Recommendations: Oral care BID;Staff/trained caregiver to provide oral care Follow up Recommendations: Skilled Nursing facility Plan: Continue with current plan of care (MBSS to be considered)     [place], MS, CCC-SLP  Watson,Katherine 06/01/2016, 3:43 PM

## 2016-06-01 NOTE — Progress Notes (Signed)
Wounds cleaned, packed and dressed according to orders.  Bowel movement noted.  Urinary catheter in place and draining.  Pt seems confused but cooperative.

## 2016-06-01 NOTE — Care Management (Addendum)
Patient transferred out of ICU to 1C 8/8 after extubation.  Patient is now DNR.  He is from [facility].  At present, it is anticipated that he will return to the facility or hospice home.  Palliative care consult in progress.

## 2016-06-01 NOTE — Progress Notes (Signed)
MD made aware that pts BS was 336 and it was noted that sliding scale insulin had been dcd while pt in CCU.  Dr. Ara Kussmaul ordered 12 units Novolog one time dose for now and wants am MD's to reassess this.

## 2016-06-01 NOTE — Progress Notes (Signed)
[facility] PROGRESS NOTE  Paul Mcmahon [id number] DOB: Jul 27, 1925 DOA: 05/27/2016 PCP: Juluis Pitch, MD  HPI/Subjective: Patient very confused and difficult to understand. Unable to get much history from him.  Objective: Vitals:   05/31/16 2112 06/01/16 0407  BP: 120/81 126/71  Pulse: 76 84  Resp: 16 16  Temp: 97.9 F (36.6 C) 98.2 F (36.8 C)    Filed Weights   05/30/16 0500 05/31/16 0500 06/01/16 0500  Weight: 82.2 kg (181 lb 3.5 oz) 86.6 kg (190 lb 14.7 oz) 83.5 kg (184 lb)    ROS: Review of Systems  Unable to perform ROS: Acuity of condition   Exam: Physical Exam  HENT:  Nose: No mucosal edema.  Mouth/Throat: No oropharyngeal exudate.  Eyes: Lids are normal. Pupils are equal, round, and reactive to light.  Neck: No tracheal tenderness present. Carotid bruit is not present. No thyromegaly present.  Cardiovascular: Regular rhythm, S1 normal and S2 normal.   Murmur heard.  Systolic murmur is present with a grade of 2/6  Respiratory: He has decreased breath sounds in the right lower field and the left lower field. He has no wheezes. He has no rhonchi. He has no rales.  Musculoskeletal:       Right ankle: He exhibits no swelling.       Left ankle: He exhibits no swelling.  Lymphadenopathy:    He has no cervical adenopathy.  Neurological: He is alert.  Patient moves all extremities  Skin: Skin is warm.  Stage III decubitus ulcer on the buttock with some brownish material in there.  Psychiatric: He is agitated.      Data Reviewed: Basic Metabolic Panel:  Recent Labs Lab 05/28/16 0050 05/28/16 0434 05/29/16 0426 05/30/16 0438 05/31/16 0500 05/31/16 1203 06/01/16 0509  NA  --  130* 132* 131*  --  132* 133*  K  --  5.0 4.6 4.3  --  3.5 3.5  CL  --  95* 97* 98*  --  97* 99*  CO2  --  26 27 28   --  26 25  GLUCOSE  --  136* 140* 115*  --  170* 236*  BUN  --  42* 43* 45*  --  35* 33*  CREATININE  --  0.76 0.78 0.69 0.69 0.70 0.71  CALCIUM  --   9.5 9.5 9.2  --  9.4 9.1  MG 2.0 2.1  --  2.3  --  2.0  --   PHOS 6.5* 6.7*  --  4.5  --  3.2  --    Liver Function Tests:  Recent Labs Lab 05/27/16 0940 05/27/16 2102 05/29/16 0426  AST 24 27 20   ALT 13* 15* 13*  ALKPHOS 104 114 80  BILITOT 0.3 0.5 0.7  PROT 5.5* 6.0* 5.4*  ALBUMIN 1.7* 1.8* 1.7*   CBC:  Recent Labs Lab 05/27/16 0940 05/27/16 2102 05/28/16 0434 05/29/16 0426 05/30/16 0438 05/31/16 0500 06/01/16 0509  WBC 15.7* 15.7* 22.2* 12.9* 10.1 8.1 10.9*  NEUTROABS 14.2* 14.6*  --   --   --   --   --   HGB 8.7* 9.9* 9.5* 8.9* 8.6* 8.4* 8.3*  HCT 27.4* 30.0* 28.6* 27.7* 26.1* 25.8* 25.1*  MCV 82.0 81.0 81.9 81.6 80.8 81.9 80.6  PLT 265 313 300 253 214 160 170   Cardiac Enzymes:  Recent Labs Lab 05/27/16 2102 05/28/16 0050 05/28/16 0434  TROPONINI 0.03* <0.03 0.03*   BNP (last 3 results)  Recent Labs  03/08/16 1357 05/27/16 2102  BNP 258.0* 169.0*      CBG:  Recent Labs Lab 05/31/16 2016 05/31/16 2349 06/01/16 0409 06/01/16 0751 06/01/16 1206  GLUCAP 336* 299* 258* 232* 205*    Recent Results (from the past 240 hour(s))  Urine culture     Status: Abnormal   Collection Time: 05/27/16  9:19 PM  Result Value Ref Range Status   Specimen Description URINE, RANDOM  Final   Special Requests NONE  Final   Culture >=100,000 COLONIES/mL YEAST (A)  Final   Report Status 05/29/2016 FINAL  Final  Blood Culture (routine x 2)     Status: None   Collection Time: 05/27/16  9:40 PM  Result Value Ref Range Status   Specimen Description BLOOD LEFT FOREARM  Final   Special Requests   Final    BOTTLES DRAWN AEROBIC AND ANAEROBIC 5CCAERO,4CCANAS   Culture NO GROWTH 5 DAYS  Final   Report Status 06/01/2016 FINAL  Final  Blood Culture (routine x 2)     Status: None   Collection Time: 05/27/16  9:44 PM  Result Value Ref Range Status   Specimen Description BLOOD RIGHT FOREARM  Final   Special Requests BOTTLES DRAWN AEROBIC AND ANAEROBIC 3CCAERO,4CCANA   Final   Culture NO GROWTH 5 DAYS  Final   Report Status 06/01/2016 FINAL  Final  Aerobic Culture (superficial specimen)     Status: None   Collection Time: 05/28/16 12:50 AM  Result Value Ref Range Status   Specimen Description SACRAL  Final   Special Requests NONE  Final   Gram Stain   Final    MODERATE WBC PRESENT, PREDOMINANTLY PMN MODERATE GRAM NEGATIVE RODS FEW GRAM POSITIVE COCCI IN PAIRS Performed at [facility]    Culture MULTIPLE ORGANISMS PRESENT, NONE PREDOMINANT  Final   Report Status 05/30/2016 FINAL  Final  Aerobic Culture (superficial specimen)     Status: None   Collection Time: 05/28/16 12:50 AM  Result Value Ref Range Status   Specimen Description THIGH LEFT UPPER  Final   Special Requests NONE  Final   Gram Stain   Final    ABUNDANT WBC PRESENT, PREDOMINANTLY PMN ABUNDANT GRAM NEGATIVE COCCOBACILLI FEW GRAM POSITIVE COCCI IN PAIRS IN CLUSTERS RARE GRAM POSITIVE RODS Performed at [facility]    Culture MULTIPLE ORGANISMS PRESENT, NONE PREDOMINANT  Final   Report Status 05/30/2016 FINAL  Final  MRSA PCR Screening     Status: Abnormal   Collection Time: 05/28/16 12:50 AM  Result Value Ref Range Status   MRSA by PCR POSITIVE (A) NEGATIVE Final    Comment:        The GeneXpert MRSA Assay (FDA approved for NASAL specimens only), is one component of a comprehensive MRSA colonization surveillance program. It is not intended to diagnose MRSA infection nor to guide or monitor treatment for MRSA infections. RESULT CALLED TO, READ BACK BY AND VERIFIED WITH: BRITTON LEE RUST CHESTER AT [id number] 05/28/16.PMH   Culture, respiratory (tracheal aspirate)     Status: None   Collection Time: 05/28/16  3:53 AM  Result Value Ref Range Status   Specimen Description TRACHEAL ASPIRATE  Final   Special Requests NONE  Final   Gram Stain   Final    ABUNDANT WBC PRESENT, PREDOMINANTLY PMN RARE SQUAMOUS EPITHELIAL CELLS PRESENT FEW GRAM VARIABLE ROD RARE GRAM  POSITIVE COCCI IN PAIRS    Culture   Final    Consistent with normal respiratory flora. Performed at [facility]  Report Status 05/30/2016 FINAL  Final      Scheduled Meds: . antiseptic oral rinse  7 mL Mouth Rinse q12n4p  . chlorhexidine  15 mL Mouth Rinse BID  . HYDROGEL  1 application Topical Daily  . QUEtiapine  25 mg Oral QHS    Assessment/Plan:  1. Acute hypoxic respiratory failure requiring mechanical ventilation during the hospital course and extubated on 05/30/2016. Made a DO NOT RESUSCITATE and DO NOT INTUBATE afterwards. 2. Severe sepsis, sacral decubitus ulcers possible healthcare associated pneumonia and yeast UTI. Since patient made comfort care measures by palliative care, antibiotics stopped. 3. Hallucinations and confusion. Start Seroquel at night if able to take. 4. Diabetes mellitus. Since made Comfort Care I will start sliding scale and Lantus 5. History of A. fib and myocardial infarction. Stopping medications at this point since [place] 6. We'll consider hospice home 7. Speech therapy decreased his diet to dysphagia 2 with nectar thick liquids.  Code Status:     Code Status Orders        Start     Ordered   05/30/16 [phone]  Do not attempt resuscitation (DNR)  Continuous    Question Answer Comment  In the event of cardiac or respiratory ARREST Do not call a "code blue"   In the event of cardiac or respiratory ARREST Do not perform Intubation, CPR, defibrillation or ACLS   In the event of cardiac or respiratory ARREST Use medication by any route, position, wound care, and other measures to relive pain and suffering. May use oxygen, suction and manual treatment of airway obstruction as needed for comfort.   Comments Plan is for a one-way extubation if no improvement by 8/7 or 8/8      05/30/16 0614    Code Status History    Date Active Date Inactive Code Status Order ID Comments User Context   05/27/2016 10:17 PM 05/28/2016 11:21 AM Partial  Code [id number]  Mikael Spray, NP ED   03/08/2016  3:57 PM 03/15/2016  1:56 PM Full Code [id number]  Lytle Butte, MD ED    Advance Directive Documentation   Flowsheet Row Most Recent Value  Type of Advance Directive  Healthcare Power of Attorney  Pre-existing out of facility DNR order (yellow form or pink MOST form)  No data  "MOST" Form in Place?  No data     Family Communication: Spoke with 2 sons and wife at the bedside Disposition Plan: Potentially hospice home  Consultants:  Palliative care  Time spent: 51 minutes  [place], [place]

## 2016-06-02 DIAGNOSIS — E440 Moderate protein-calorie malnutrition: Secondary | ICD-10-CM | POA: Insufficient documentation

## 2016-06-02 MED ORDER — LORAZEPAM 2 MG/ML PO CONC
1.0000 mg | ORAL | Status: DC | PRN
  Filled 2016-06-02: qty 0.5

## 2016-06-02 MED ORDER — MORPHINE SULFATE (CONCENTRATE) 10 MG/0.5ML PO SOLN: 10.0000 mg | ORAL | 0 refills | Status: AC | PRN

## 2016-06-02 MED ORDER — LORAZEPAM 2 MG/ML PO CONC: 1.0000 mg | ORAL | 0 refills | Status: AC | PRN

## 2016-06-02 MED ORDER — MORPHINE SULFATE (CONCENTRATE) 10 MG/0.5ML PO SOLN: 10.0000 mg | ORAL | Status: DC | PRN

## 2016-06-02 NOTE — Progress Notes (Signed)
Daily Progress Note   Patient Name: Paul Mcmahon       Date: 06/02/2016 DOB: April 27, 1925  Age: 82 y.o. MRN#: [id number] Attending Physician: Loletha Grayer, MD Primary Care Physician: Juluis Pitch, MD Admit Date: 05/27/2016  Reason for Consultation/Follow-up: Establishing goals of care, Hospice Evaluation, Psychosocial/spiritual support and Terminal Care  Subjective:  -continued support to patient and family, patient is transitioning at EOL, hopeful for hospice facility today  -family understands the limited prognosis and are comfortable with a full comfort care path  -education offered and questions addressed regarding natural trajectory at EOL  Length of Stay: 6  Current Medications: Scheduled Meds:  . antiseptic oral rinse  7 mL Mouth Rinse q12n4p  . chlorhexidine  15 mL Mouth Rinse BID  . HYDROGEL  1 application Topical Daily  . QUEtiapine  25 mg Oral QHS    Continuous Infusions:    PRN Meds: sodium chloride, acetaminophen, albuterol, bisacodyl, LORazepam, morphine injection, ondansetron (ZOFRAN) IV  Physical Exam          Vital Signs: BP 111/87 (BP Location: Right Arm)   Pulse 80   Temp 97.8 F (36.6 C) (Oral)   Resp 14   Ht 6' (1.829 m)   Wt 82.6 kg (182 lb)   SpO2 98%   BMI 24.68 kg/m  SpO2: SpO2: 98 % O2 Device: O2 Device: Nasal Cannula O2 Flow Rate: O2 Flow Rate (L/min): 2 L/min  Intake/output summary:  Intake/Output Summary (Last 24 hours) at 06/02/16 0847 Last data filed at 06/01/16 1118  Gross per 24 hour  Intake                0 ml  Output              450 ml  Net             -450 ml   LBM: Last BM Date: 06/01/16 Baseline Weight: Weight: 88.6 kg (195 lb 5.2 oz) Most recent weight: Weight: 82.6 kg (182 lb)       Palliative Assessment/Data:   20 %    Flowsheet Rows   Flowsheet Row Most Recent Value  Intake Tab  Referral Department  Critical care  Unit at Time of Referral  ICU  Palliative Care Primary Diagnosis  Sepsis/Infectious Disease  Date Notified  05/30/16  Palliative Care Type  Not seen  Reason Not Seen  Hospice referral more appropriate  Reason for referral  Clarify Goals of Care, Counsel Regarding Hospice  Date of Admission  05/27/16  # of days IP prior to Palliative referral  3  Clinical Assessment  Psychosocial & Spiritual Assessment  Palliative Care Outcomes      Patient Active Problem List   Diagnosis Date Noted  . Malnutrition of moderate degree 06/02/2016  . Palliative care by specialist 06/01/2016  . DNR (do not resuscitate) 06/01/2016  . Tachypnea   . Acute congestive heart failure ([place])   . Acute respiratory failure ([place])   . Acute on chronic respiratory failure with hypoxia ([place]) 05/27/2016  . Protein-calorie malnutrition, severe 03/10/2016  . Acute urinary tract infection   . Atrial fibrillation with RVR ([place])   . Infected pressure ulcer   . Failure to thrive (0-17)   . Sepsis ([place]) 03/08/2016  . Pressure ulcer 03/08/2016  . Arteriosclerosis of coronary artery 06/06/2014  . Controlled type 2 diabetes mellitus without complication ([place]) [id number]  . H/O malignant neoplasm of prostate 06/06/2014  . HLD (hyperlipidemia) 06/06/2014  . BP (high blood pressure) 06/06/2014  . Osteopenia 06/06/2014    Palliative Care Assessment & Plan    Assessment: -82 y.o. male   admitted on 05/27/2016 with PMH of afib (on Eliquis), prostate Ca, T2DM and CAD S/P MI who presents with hypoxia with SPO2 in the 57s, AMS and suspected aspiration.   -Continued physical, functional and cognitive decline, overall poor prognosis and shift to comfort  Recommendations/Plan:  Maximize comfort and transition to hospice facility  Goals of Care and Additional Recommendations:  Limitations on Scope of Treatment:  Full Comfort Care  Code Status:    Code Status Orders        Start     Ordered   05/30/16 0613  Do not attempt resuscitation (DNR)  Continuous    Question Answer Comment  In the event of cardiac or respiratory ARREST Do not call a "code blue"   In the event of cardiac or respiratory ARREST Do not perform Intubation, CPR, defibrillation or ACLS   In the event of cardiac or respiratory ARREST Use medication by any route, position, wound care, and other measures to relive pain and suffering. May use oxygen, suction and manual treatment of airway obstruction as needed for comfort.   Comments Plan is for a one-way extubation if no improvement by 8/7 or 8/8      05/30/16 0614    Code Status History    Date Active Date Inactive Code Status Order ID Comments User Context   05/27/2016 10:17 PM 05/28/2016 11:21 AM Partial Code [id number]  Mikael Spray, NP ED   03/08/2016  3:57 PM 03/15/2016  1:56 PM Full Code [id number]  Lytle Butte, MD ED    Advance Directive Documentation   Flowsheet Row Most Recent Value  Type of Advance Directive  Healthcare Power of Attorney  Pre-existing out of facility DNR order (yellow form or pink MOST form)  No data  "MOST" Form in Place?  No data       Prognosis:   < 2 weeks  Discharge Planning:  Hospice facility  Care plan was discussed with Dr Leslye Peer  Thank you for allowing the Palliative Medicine Team to assist in the care of this patient.   Time In: 0810 Time Out: 0845 Total Time 35 min Prolonged Time Billed  no  Greater than 50%  of this time was spent counseling and coordinating care related to the above assessment and plan.  Wadie Lessen, NP  Please contact Palliative Medicine Team phone at [phone] for questions and concerns.

## 2016-06-02 NOTE — Care Management (Addendum)
Per attending, patient is now comfort care and hospice screen is needed.  CM spoke with patient's granddaughter and it is the family's wish patient transfer to the Hospice Home.  Request that patient's wife be able to stay the night with the patient - that when patient was in skilled nursing, she was not allowed to stay.  Wife has advanced dementia.  Discussed that if wife stayed with patient, there would have to be another family member that also stays with the wife.  Verbalized understanding.   [place] liaison informed

## 2016-06-02 NOTE — Discharge Summary (Signed)
[facility] NAME: Paul Mcmahon    MR#:  [id number]  DATE OF BIRTH:  06/09/1925  DATE OF ADMISSION:  05/27/2016 ADMITTING PHYSICIAN: Wilhelmina Mcardle, MD  DATE OF DISCHARGE: 06/02/2016  PRIMARY CARE PHYSICIAN: Juluis Pitch, MD    ADMISSION DIAGNOSIS:  Tachypnea [R06.82] UTI (lower urinary tract infection) [N39.0] Decubitus ulcer [L89.90] Encounter for intubation [Z01.818] Severe sepsis ([place]) [A41.9, R65.20] Acute respiratory failure with hypoxemia (HCC) [J96.01] Acute congestive heart failure, unspecified congestive heart failure type ([place]) [I50.9] Aspiration pneumonia of both lower lobes due to regurgitated food ([place]) [J69.0]  DISCHARGE DIAGNOSIS:  Active Problems:   Acute on chronic respiratory failure with hypoxia (HCC)   Acute congestive heart failure (HCC)   Acute respiratory failure ([place])   Palliative care by specialist   DNR (do not resuscitate)   Tachypnea   Malnutrition of moderate degree   SECONDARY DIAGNOSIS:   Past Medical History:  Diagnosis Date  . A-fib ([place])   . Cancer ([place])   . Diabetes mellitus without complication ([place])   . MI (myocardial infarction) ([place])   . Pressure ulcer of sacrum    Chronic, start date unknown    HOSPITAL COURSE:   1. Acute hypoxic respiratory failure requiring mechanical ventilation during the hospital course and extubated 05/30/2006. He was made a DO NOT RESUSCITATE and DO NOT INTUBATE afterwards. Currently breathing on nasal cannula. 2. Severe sepsis with stage III sacral decubitus ulcer and possible healthcare associated pneumonia and yeast UTI. The patient was given aggressive antibiotics including vancomycin and Zosyn and IV Diflucan. Antibiotics were stopped once the patient was made comfort care measures. Patient will be discharged to the hospice home. 3. Hallucinations and confusion. Patient unable to take Seroquel last night. 4. Type 2 diabetes mellitus. No further  management and testing since made comfort care. 5. History of atrial fibrillation myocardial infarction. With comfort care measures patient was stopped off of medications 6. Speech therapy decreased diet to dysphagia 2 with nectar thick liquids. 7. Today the patient is shaking his head yes and no but not talking to me. 8. Failure to thrive discharged to hospice home. 9. History of pulmonary embolism in the past 10. Stage III decubitus ulcer local wound care with wet to dry dressing.  DISCHARGE CONDITIONS:   Guarded  CONSULTS OBTAINED:  Treatment Team:  Loletha Grayer, MD  DRUG ALLERGIES:   Allergies  Allergen Reactions  . Penicillins Other (See Comments)    Reaction: Welts and blisters all over body    DISCHARGE MEDICATIONS:   Current Discharge Medication List    START taking these medications   Details  LORazepam (ATIVAN) 2 MG/ML concentrated solution Take 0.5 mLs (1 mg total) by mouth every 4 (four) hours as needed for anxiety. Qty: 30 mL, Refills: 0    Morphine Sulfate (MORPHINE CONCENTRATE) 10 MG/0.5ML SOLN concentrated solution Take 0.5 mLs (10 mg total) by mouth every 2 (two) hours as needed for severe pain or shortness of breath. Qty: 30 mL, Refills: 0      STOP taking these medications     acetaminophen (TYLENOL) 325 MG tablet      apixaban (ELIQUIS) 5 MG TABS tablet      atorvastatin (LIPITOR) 10 MG tablet      b complex vitamins tablet      cholecalciferol (VITAMIN D) 1000 units tablet      digoxin (LANOXIN) 0.125 MG tablet      feeding supplement, ENSURE ENLIVE, (  ENSURE ENLIVE) LIQD      fentaNYL (DURAGESIC - DOSED MCG/HR) 25 MCG/HR patch      ferrous sulfate 325 (65 FE) MG tablet      furosemide (LASIX) 20 MG tablet      insulin aspart (NOVOLOG) 100 UNIT/ML injection      insulin aspart protamine- aspart (NOVOLOG MIX 70/30) (70-30) 100 UNIT/ML injection      LEVOFLOXACIN IV      magnesium oxide (MAG-OX) 400 (241.3 Mg) MG tablet       metoprolol succinate (TOPROL-XL) 50 MG 24 hr tablet      mirtazapine (REMERON) 7.5 MG tablet      Multiple Vitamin (MULTIVITAMIN WITH MINERALS) TABS tablet      ondansetron (ZOFRAN) 4 MG tablet      oxyCODONE (OXY IR/ROXICODONE) 5 MG immediate release tablet      polyethylene glycol (MIRALAX / GLYCOLAX) packet      sennosides-docusate sodium (SENOKOT-S) 8.6-50 MG tablet      tamsulosin (FLOMAX) 0.4 MG CAPS capsule      VANCOMYCIN HCL IV          DISCHARGE INSTRUCTIONS:   Follow-up with Dr. at hospice home  If you experience worsening of your admission symptoms, develop shortness of breath, life threatening emergency, suicidal or homicidal thoughts you must seek medical attention immediately by calling 911 or calling your MD immediately  if symptoms less severe.  You Must read complete instructions/literature along with all the possible adverse reactions/side effects for all the Medicines you take and that have been prescribed to you. Take any new Medicines after you have completely understood and accept all the possible adverse reactions/side effects.   Please note  You were cared for by a hospitalist during your hospital stay. If you have any questions about your discharge medications or the care you received while you were in the hospital after you are discharged, you can call the unit and asked to speak with the hospitalist on call if the hospitalist that took care of you is not available. Once you are discharged, your primary care physician will handle any further medical issues. Please note that NO REFILLS for any discharge medications will be authorized once you are discharged, as it is imperative that you return to your primary care physician (or establish a relationship with a primary care physician if you do not have one) for your aftercare needs so that they can reassess your need for medications and monitor your lab values.    Today   CHIEF COMPLAINT:   Chief  Complaint  Patient presents with  . Respiratory Distress    HISTORY OF PRESENT ILLNESS:  Paul Mcmahon  is a 82 y.o. male presented with respiratory distress and admitted by the ICU specialist.   VITAL SIGNS:  Blood pressure 111/87, pulse 80, temperature 97.8 F (36.6 C), temperature source Oral, resp. rate 14, height 6' (1.829 m), weight 82.6 kg (182 lb), SpO2 98 %.   PHYSICAL EXAMINATION:  GENERAL:  82 y.o.-year-old patient lying in the bed with no acute distress.  EYES: Pupils equal, round, reactive to light. No scleral icterus.  HEENT: Head atraumatic, normocephalic. Oropharynx and nasopharynx clear.  NECK:  Supple, no jugular venous distention. No thyroid enlargement, no tenderness.  LUNGS: Normal breath sounds bilaterally, no wheezing, rales,rhonchi or crepitation. No use of accessory muscles of respiration.  CARDIOVASCULAR: S1, S2 normal. No murmurs, rubs, or gallops.  ABDOMEN: Soft, non-tender, non-distended. Bowel sounds present. No organomegaly or mass.  EXTREMITIES:  trace edema. No cyanosis, or clubbing.  NEUROLOGIC: patient not talking but does shake his head to yes and no questions PSYCHIATRIC: The patient is alert.  SKIN: Stage 3 decubitus ulcer  DATA REVIEW:   CBC  Recent Labs Lab 06/01/16 0509  WBC 10.9*  HGB 8.3*  HCT 25.1*  PLT 170    Chemistries   Recent Labs Lab 05/29/16 0426  05/31/16 1203 06/01/16 0509  NA 132*  < > 132* 133*  K 4.6  < > 3.5 3.5  CL 97*  < > 97* 99*  CO2 27  < > 26 25  GLUCOSE 140*  < > 170* 236*  BUN 43*  < > 35* 33*  CREATININE 0.78  < > 0.70 0.71  CALCIUM 9.5  < > 9.4 9.1  MG  --   < > 2.0  --   AST 20  --   --   --   ALT 13*  --   --   --   ALKPHOS 80  --   --   --   BILITOT 0.7  --   --   --   < > = values in this interval not displayed.  Cardiac Enzymes  Recent Labs Lab 05/28/16 0434  TROPONINI 0.03*    Microbiology Results  Results for orders placed or performed during the hospital encounter of 05/27/16   Urine culture     Status: Abnormal   Collection Time: 05/27/16  9:19 PM  Result Value Ref Range Status   Specimen Description URINE, RANDOM  Final   Special Requests NONE  Final   Culture >=100,000 COLONIES/mL YEAST (A)  Final   Report Status 05/29/2016 FINAL  Final  Blood Culture (routine x 2)     Status: None   Collection Time: 05/27/16  9:40 PM  Result Value Ref Range Status   Specimen Description BLOOD LEFT FOREARM  Final   Special Requests   Final    BOTTLES DRAWN AEROBIC AND ANAEROBIC 5CCAERO,4CCANAS   Culture NO GROWTH 5 DAYS  Final   Report Status 06/01/2016 FINAL  Final  Blood Culture (routine x 2)     Status: None   Collection Time: 05/27/16  9:44 PM  Result Value Ref Range Status   Specimen Description BLOOD RIGHT FOREARM  Final   Special Requests BOTTLES DRAWN AEROBIC AND ANAEROBIC 3CCAERO,4CCANA  Final   Culture NO GROWTH 5 DAYS  Final   Report Status 06/01/2016 FINAL  Final  Aerobic Culture (superficial specimen)     Status: None   Collection Time: 05/28/16 12:50 AM  Result Value Ref Range Status   Specimen Description SACRAL  Final   Special Requests NONE  Final   Gram Stain   Final    MODERATE WBC PRESENT, PREDOMINANTLY PMN MODERATE GRAM NEGATIVE RODS FEW GRAM POSITIVE COCCI IN PAIRS Performed at [facility]    Culture MULTIPLE ORGANISMS PRESENT, NONE PREDOMINANT  Final   Report Status 05/30/2016 FINAL  Final  Aerobic Culture (superficial specimen)     Status: None   Collection Time: 05/28/16 12:50 AM  Result Value Ref Range Status   Specimen Description THIGH LEFT UPPER  Final   Special Requests NONE  Final   Gram Stain   Final    ABUNDANT WBC PRESENT, PREDOMINANTLY PMN ABUNDANT GRAM NEGATIVE COCCOBACILLI FEW GRAM POSITIVE COCCI IN PAIRS IN CLUSTERS RARE GRAM POSITIVE RODS Performed at [facility]    Culture MULTIPLE ORGANISMS PRESENT, NONE PREDOMINANT  Final  Report Status 05/30/2016 FINAL  Final  MRSA PCR Screening     Status:  Abnormal   Collection Time: 05/28/16 12:50 AM  Result Value Ref Range Status   MRSA by PCR POSITIVE (A) NEGATIVE Final    Comment:        The GeneXpert MRSA Assay (FDA approved for NASAL specimens only), is one component of a comprehensive MRSA colonization surveillance program. It is not intended to diagnose MRSA infection nor to guide or monitor treatment for MRSA infections. RESULT CALLED TO, READ BACK BY AND VERIFIED WITH: BRITTON LEE RUST CHESTER AT [id number] 05/28/16.PMH   Culture, respiratory (tracheal aspirate)     Status: None   Collection Time: 05/28/16  3:53 AM  Result Value Ref Range Status   Specimen Description TRACHEAL ASPIRATE  Final   Special Requests NONE  Final   Gram Stain   Final    ABUNDANT WBC PRESENT, PREDOMINANTLY PMN RARE SQUAMOUS EPITHELIAL CELLS PRESENT FEW GRAM VARIABLE ROD RARE GRAM POSITIVE COCCI IN PAIRS    Culture   Final    Consistent with normal respiratory flora. Performed at [facility]    Report Status 05/30/2016 FINAL  Final    Management plans discussed with the patient, family and they are in agreement.  CODE STATUS:     Code Status Orders        Start     Ordered   05/30/16 [phone]  Do not attempt resuscitation (DNR)  Continuous    Question Answer Comment  In the event of cardiac or respiratory ARREST Do not call a "code blue"   In the event of cardiac or respiratory ARREST Do not perform Intubation, CPR, defibrillation or ACLS   In the event of cardiac or respiratory ARREST Use medication by any route, position, wound care, and other measures to relive pain and suffering. May use oxygen, suction and manual treatment of airway obstruction as needed for comfort.   Comments Plan is for a one-way extubation if no improvement by 8/7 or 8/8      05/30/16 0614    Code Status History    Date Active Date Inactive Code Status Order ID Comments User Context   05/27/2016 10:17 PM 05/28/2016 11:21 AM Partial Code [id number]  Mikael Spray, NP ED   03/08/2016  3:57 PM 03/15/2016  1:56 PM Full Code [id number]  Lytle Butte, MD ED    Advance Directive Documentation   Flowsheet Row Most Recent Value  Type of Advance Directive  Healthcare Power of Attorney  Pre-existing out of facility DNR order (yellow form or pink MOST form)  No data  "MOST" Form in Place?  No data      TOTAL TIME TAKING CARE OF THIS PATIENT: 35 minutes.    Loletha Grayer M.D on 06/02/2016 at 1:27 PM  Between 7am to 6pm - Pager - [phone]  After 6pm go to [email or web address] - password [email or web address] [facility]  [phone]  CC: Primary care physician; Juluis Pitch, MD

## 2016-06-02 NOTE — Progress Notes (Addendum)
CSW informed [place] that patient's family was intersted in [facility] She reports that there is a bed avalible today. CSW informed [place] of above. [place] contacted Maudie Mercury- Admissions Coordinator at [facility] of above. CSW completed EMS form and discharge packet. Patient will discharge today to [facility].   Ernest Pine, MSW, LCSW, [place] Clinical Social Worker [phone]

## 2016-06-02 NOTE — Progress Notes (Signed)
New Hospice home referral received from [facility] following a Palliative Medicine consult with NP Wadie Lessen. Patient is an 82 year old man admitted from [facility] SNF on 8/4 for respiratory failure requiring Bipap, then intubation. He has received IV antibiotics for pneumonia and has continued despite medical interventions to decline. He has  PMH that includes A-fib, prostate cancer, DM II, MI as well several wounds, including a non-stageable sacral wound for which he was treated with IV vancomycin at the SNF. Patient's sons met with Palliative Medicine NP Wadie Lessen this morning and have chosen to focus on patient's comfort with transfer to the Hospice home for [facility]. Writer met in the family room with patient's son Marden Noble and granddaughter to initiate education regarding hospice services, philosophy and team approach to care with good understanding voiced. Questions answered, consents signed.  Patient seen lying in bed, alert, able to slowly respond to y/n questions. Repeatedly stated "I want to go home". He was fed some breakfast this morning by his wife. Patient denied pain. Per report of staff RN chris, patient is uncomfortable when turned at during dressing changes. Patient information faxed to hospice referral. Report called to the hospice home. EMS notified for transport. Signed portable DNR in place in patient's discharge packet. Hospital care all aware. Thank you for the opportunity to be involved in the care of this patient and his family. Flo Shanks RN, BSN, [facility] [place], hospital Liaison [phone] c

## 2016-06-02 NOTE — Progress Notes (Signed)
Speech Therapy Note: Reviewed chart notes; consulted NSG and CM re: pt's status today. Pt has been moved to comfort care status at this time per family request. Recommend continuing w/ current diet for meals (and monitor toleration of any po's carefully) w/ strict aspiration precautions - do not feed pt any po's if not fully awake/alert and to stop if any increased coughing. NSG to reconsult ST services if any need for further education of diet consistency. Precautions were posted in room. NSG updated.

## 2016-06-24 ENCOUNTER — Encounter
Admission: RE | Admit: 2016-06-24 | Payer: Medicare Other | Source: Ambulatory Visit | Attending: Internal Medicine | Admitting: Internal Medicine

## 2016-06-24 ENCOUNTER — Encounter: Admission: RE | Admit: 2016-06-24 | Payer: Medicare Other | Source: Ambulatory Visit | Admitting: Internal Medicine

## 2016-06-24 DEATH — deceased

## 2016-12-28 IMAGING — DX DG CHEST 1V PORT
1 series · 1 of 1 positions shown · non-contrast
Comparison: 01/12/2009

CLINICAL DATA: Lower extremity edema.

EXAM:
PORTABLE CHEST 1 VIEW

[chest ap]
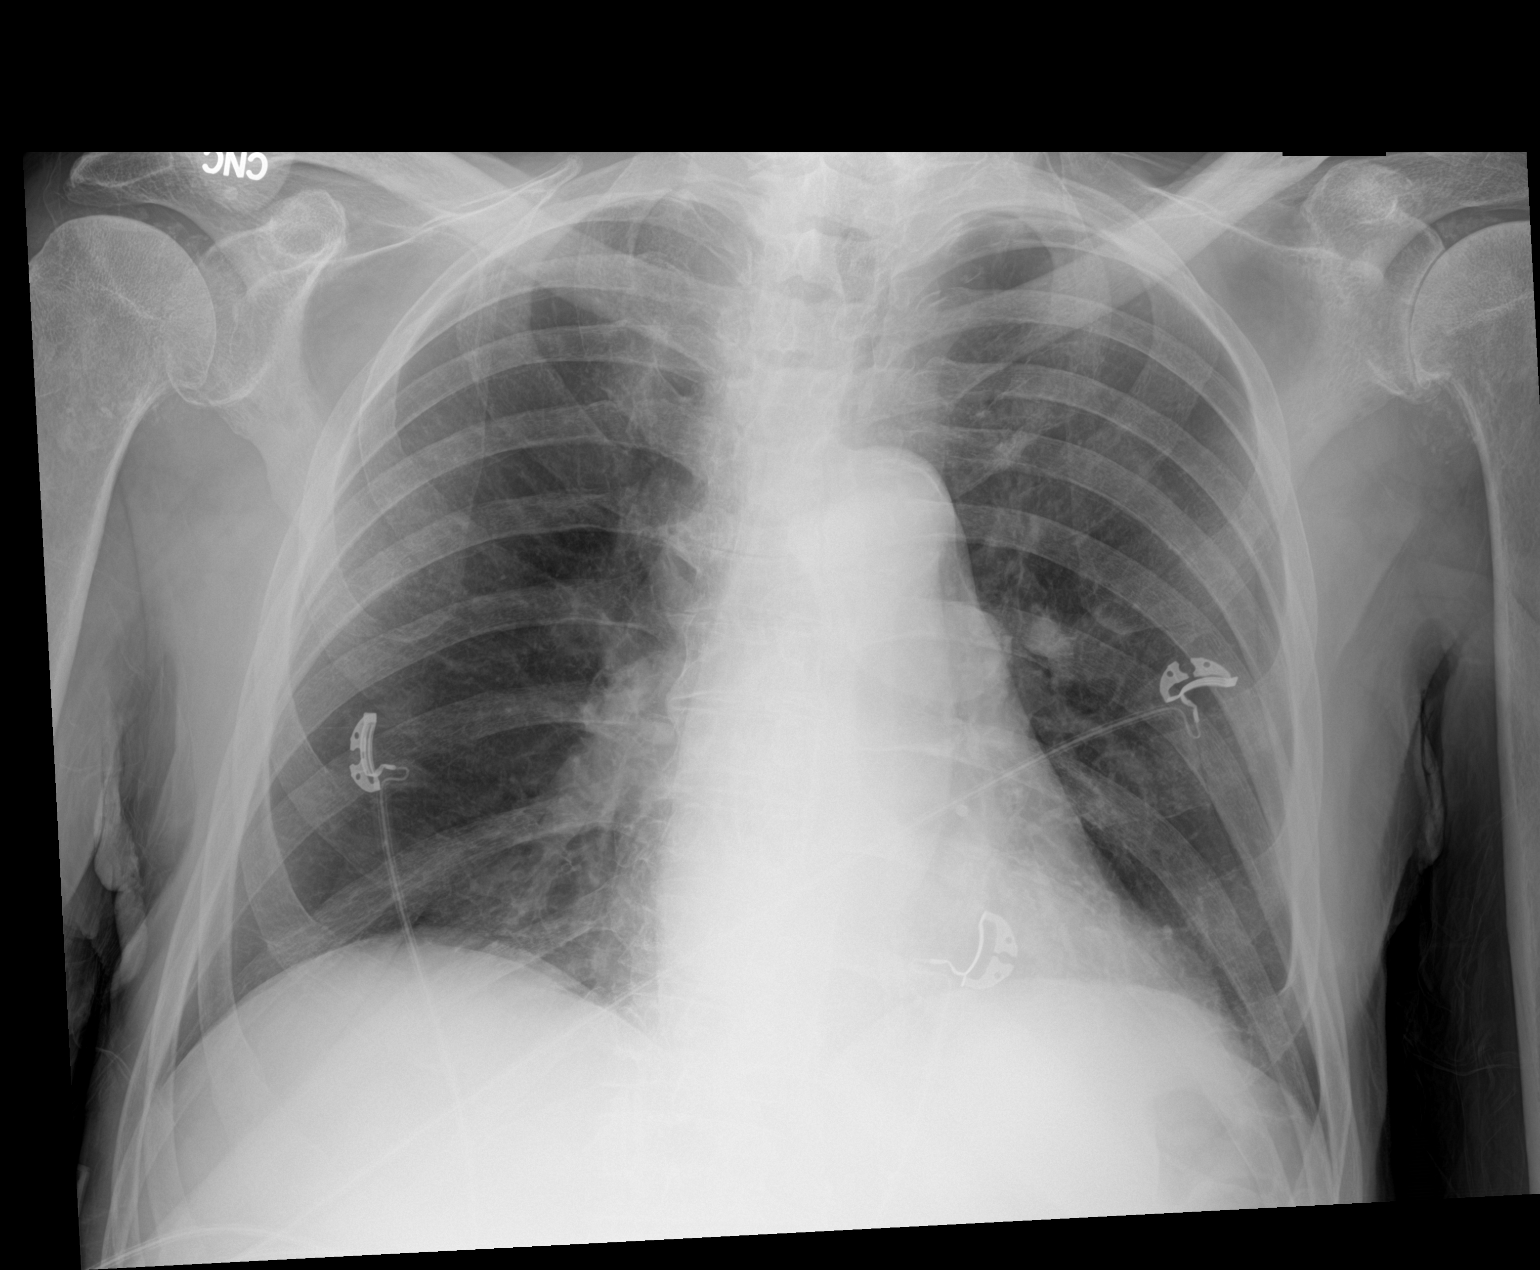

[1 of 1 positions shown; findings below may reference images not displayed]

FINDINGS: The heart size and mediastinal contours are within normal limits.
Both lungs are clear. The visualized skeletal structures are
unremarkable.
IMPRESSION: No active disease.

## 2017-03-18 IMAGING — DX DG ABDOMEN 1V
1 series · 1 of 1 positions shown · non-contrast
Comparison: CT dated 03/08/2016

CLINICAL DATA: 89-year-old male with enteric tube placement

EXAM:
ABDOMEN - 1 VIEW

[abdomen kub]
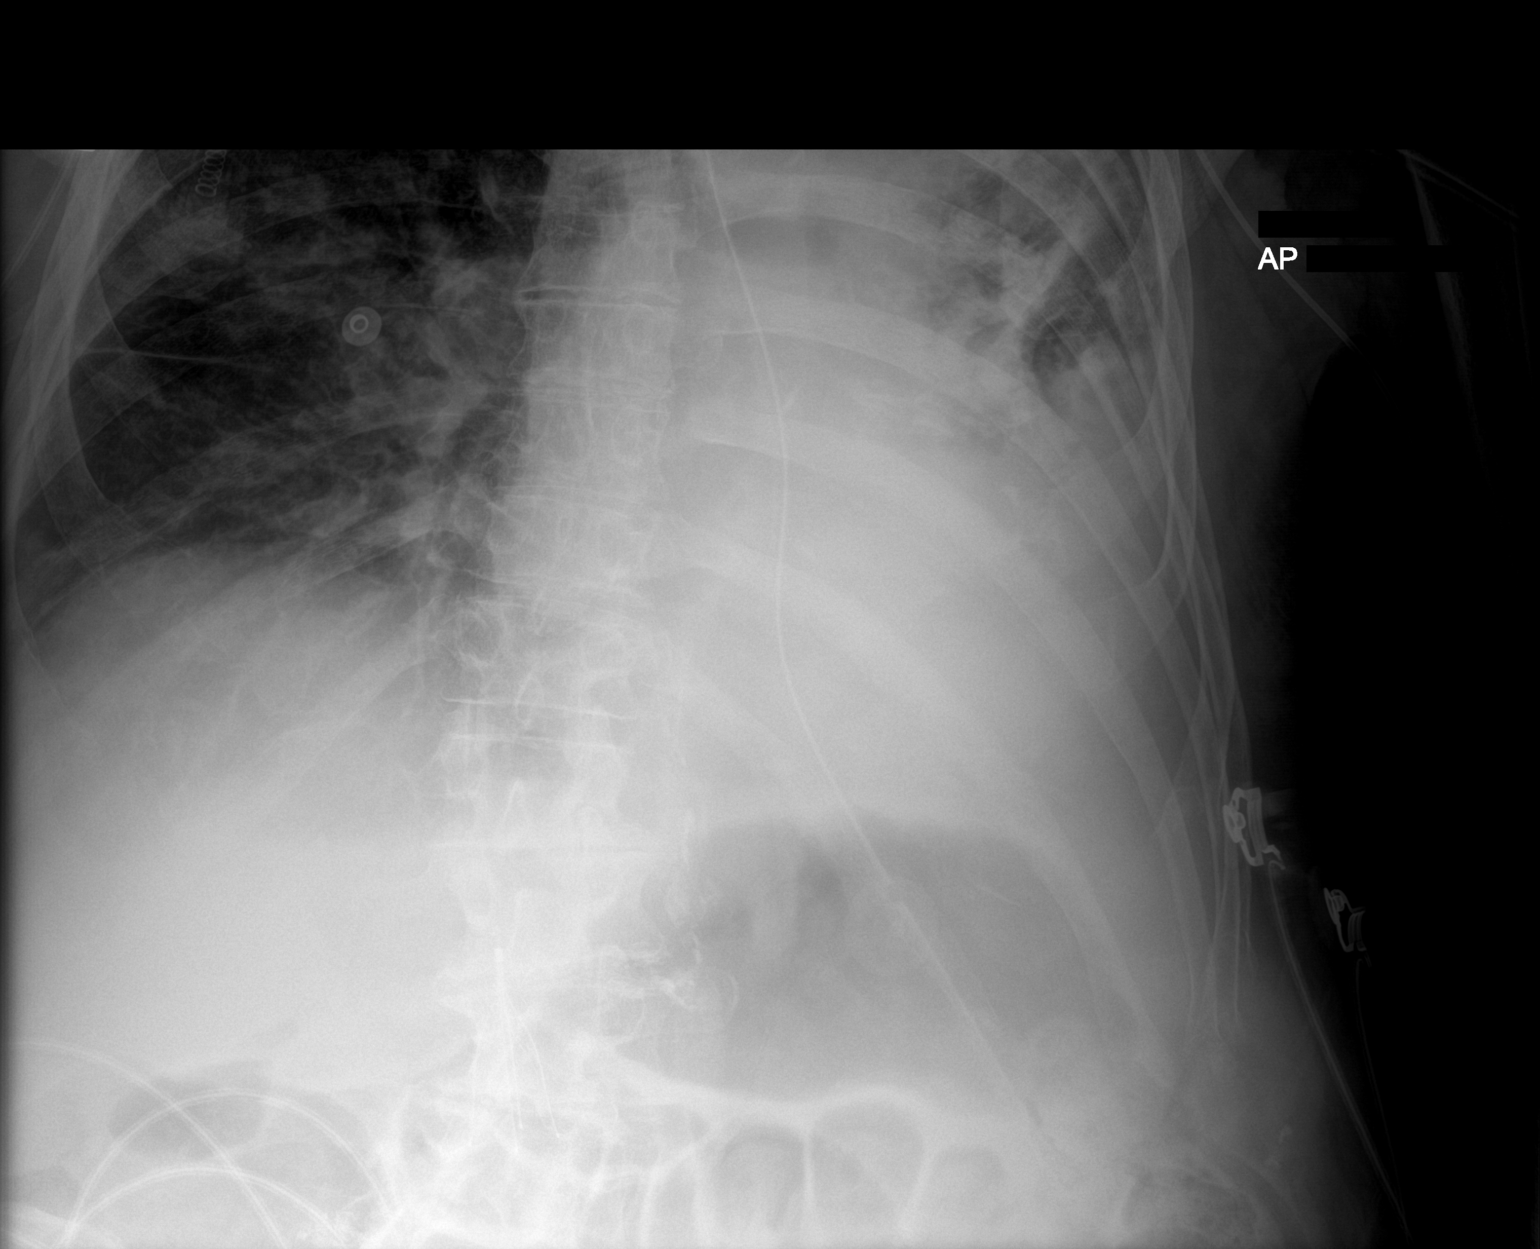

[1 of 1 positions shown; findings below may reference images not displayed]

FINDINGS: An enteric tube is noted with tip and side-port in the proximal
stomach. A left-sided PICC is partially visualized.

Large areas of opacification predominantly involving the left hemi
thorax. There is only small aerated portion of the lung in the left
mid lung field. The patchy and nodular airspace opacity involving
the right flank noted. There is silhouetting of the cardiac border.
There is degenerative changes of the spine. No acute fracture.
IMPRESSION: Enteric tube with tip in the stomach.

Bilateral airspace opacities with near complete opacification of the
left lung.

## 2017-03-20 IMAGING — DX DG ABDOMEN 1V
2 series · 2 of 2 positions shown · non-contrast
Comparison: Radiograph dated 05/27/2016

CLINICAL DATA: 89-year-old male with abdominal distention

EXAM:
ABDOMEN - 1 VIEW

[abdomen kub (1 of 2)]
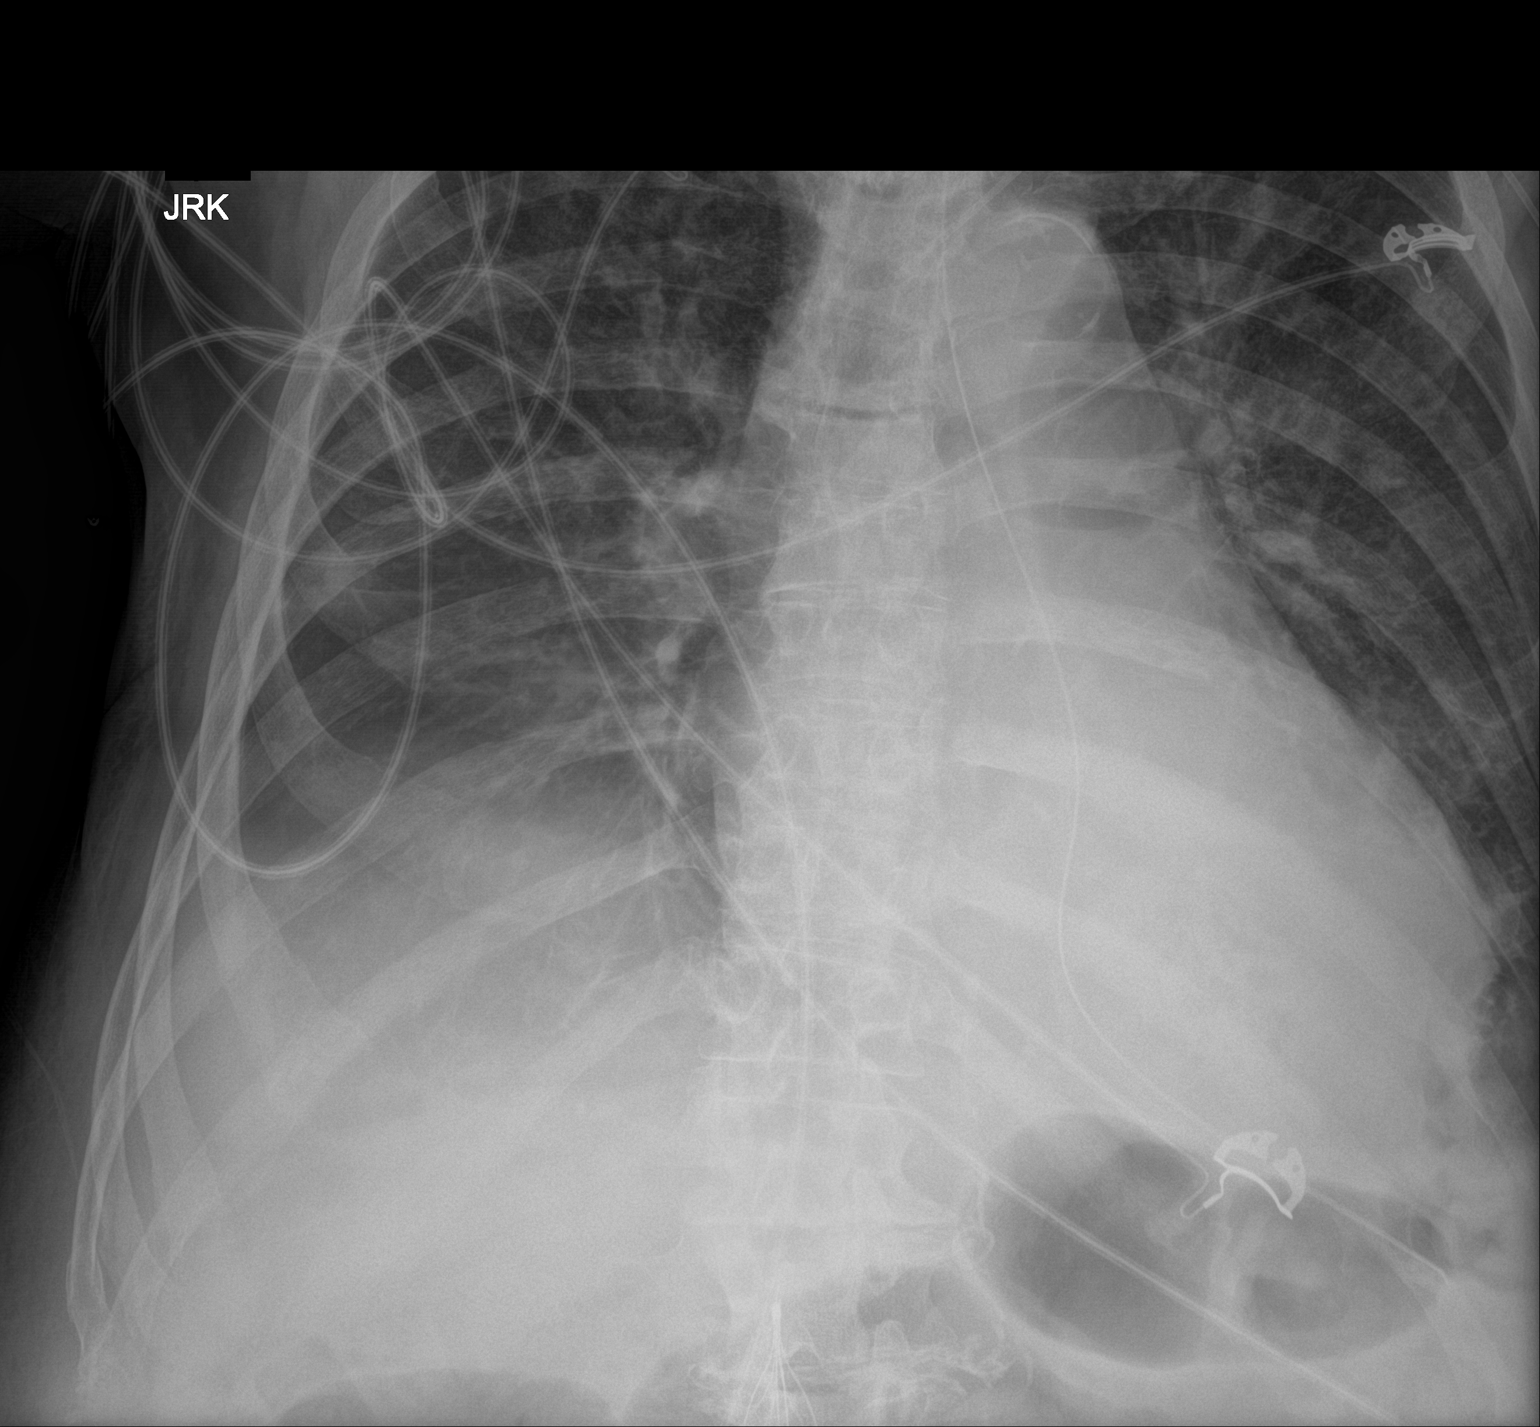

[abdomen kub (2 of 2)]
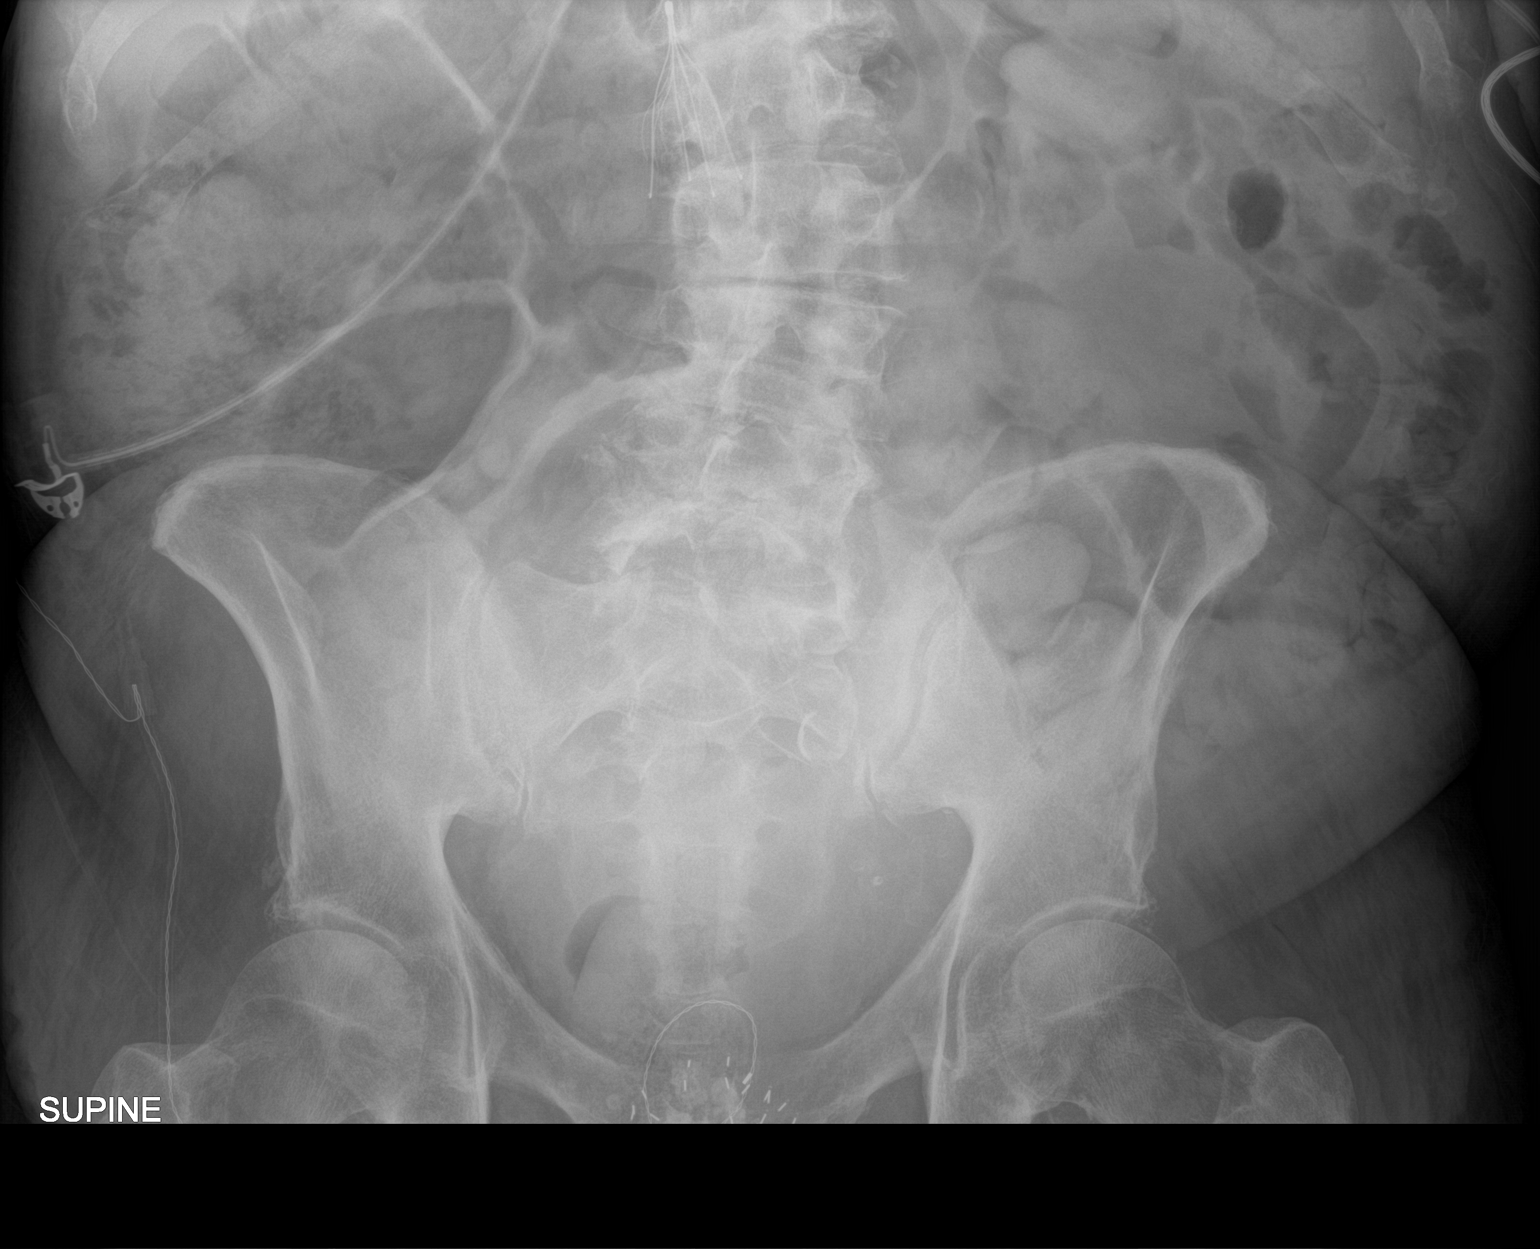

[2 of 2 positions shown; findings below may reference images not displayed]

FINDINGS: Endotracheal tube partially visualized with tip approximately 6 cm
above the carina. Enteric tube extends into the left upper abdomen
with tip over the gastric bubble. Left-sided PICC with tip likely in
the left innominate vein. Recommend advancing into the SVC. There is
cardiomegaly with mild congestive changes. There has been interval
clearing of the left lung opacity with probable residual airspace
disease at the left lung base. Small bilateral pleural effusions. No
pneumothorax.

There is moderate stool within the colon. No bowel dilatation or
evidence of obstruction. No free air. An IVC filter noted. Prostate
brachytherapy seeds. A catheter is noted over the lower pelvis.
There is scoliosis with degenerative changes of the spine. No acute
fracture.
IMPRESSION: Interval clearing of the left upper lobe airspace disease.

Mild cardiomegaly with congestive changes and small bilateral
pleural effusions.

Left-sided PICC with tip in the left innominate vein. Recommend
advancement of the catheter by approximately 5 cm into the SVC.

Constipation.  No bowel obstruction.
